# Patient Record
Sex: Female | Born: 1960 | Race: White | Hispanic: No | Marital: Married | Smoking: Never smoker
Health system: Southern US, Community
[De-identification: ages and names within clinical notes are randomized; demographics above are authoritative.]

## PROBLEM LIST (undated history)

## (undated) DIAGNOSIS — R519 Headache, unspecified: Secondary | ICD-10-CM

## (undated) DIAGNOSIS — R600 Localized edema: Secondary | ICD-10-CM

## (undated) DIAGNOSIS — Z87442 Personal history of urinary calculi: Secondary | ICD-10-CM

## (undated) DIAGNOSIS — I447 Left bundle-branch block, unspecified: Secondary | ICD-10-CM

## (undated) DIAGNOSIS — G8929 Other chronic pain: Secondary | ICD-10-CM

## (undated) DIAGNOSIS — I499 Cardiac arrhythmia, unspecified: Secondary | ICD-10-CM

## (undated) DIAGNOSIS — E785 Hyperlipidemia, unspecified: Secondary | ICD-10-CM

## (undated) DIAGNOSIS — N6310 Unspecified lump in the right breast, unspecified quadrant: Secondary | ICD-10-CM

## (undated) HISTORY — PX: MANDIBLE SURGERY: SHX707

## (undated) HISTORY — PX: CHOLECYSTECTOMY: SHX55

## (undated) HISTORY — PX: BREAST EXCISIONAL BIOPSY: SUR124

## (undated) HISTORY — PX: LUMBAR FUSION: SHX111

## (undated) HISTORY — PX: SPINE SURGERY: SHX786

## (undated) HISTORY — PX: CERVICAL FUSION: SHX112

## (undated) HISTORY — PX: ABDOMINAL HYSTERECTOMY: SHX81

---

## 1998-03-31 ENCOUNTER — Other Ambulatory Visit: Admission: RE | Admit: 1998-03-31 | Discharge: 1998-03-31 | Payer: Self-pay | Admitting: Obstetrics and Gynecology

## 1999-06-01 ENCOUNTER — Other Ambulatory Visit: Admission: RE | Admit: 1999-06-01 | Discharge: 1999-06-01 | Payer: Self-pay | Admitting: Obstetrics and Gynecology

## 2000-06-20 ENCOUNTER — Other Ambulatory Visit: Admission: RE | Admit: 2000-06-20 | Discharge: 2000-06-20 | Payer: Self-pay | Admitting: Obstetrics and Gynecology

## 2001-07-06 ENCOUNTER — Other Ambulatory Visit: Admission: RE | Admit: 2001-07-06 | Discharge: 2001-07-06 | Payer: Self-pay | Admitting: Obstetrics and Gynecology

## 2002-07-05 ENCOUNTER — Other Ambulatory Visit: Admission: RE | Admit: 2002-07-05 | Discharge: 2002-07-05 | Payer: Self-pay | Admitting: Obstetrics and Gynecology

## 2003-07-05 ENCOUNTER — Other Ambulatory Visit: Admission: RE | Admit: 2003-07-05 | Discharge: 2003-07-05 | Payer: Self-pay | Admitting: Obstetrics and Gynecology

## 2003-10-09 ENCOUNTER — Ambulatory Visit (HOSPITAL_COMMUNITY): Admission: RE | Admit: 2003-10-09 | Discharge: 2003-10-09 | Payer: Self-pay | Admitting: Neurosurgery

## 2003-12-19 ENCOUNTER — Encounter: Admission: RE | Admit: 2003-12-19 | Discharge: 2003-12-29 | Payer: Self-pay | Admitting: Neurology

## 2004-01-05 ENCOUNTER — Encounter: Admission: RE | Admit: 2004-01-05 | Discharge: 2004-01-05 | Payer: Self-pay | Admitting: Neurosurgery

## 2004-01-20 ENCOUNTER — Encounter: Admission: RE | Admit: 2004-01-20 | Discharge: 2004-01-20 | Payer: Self-pay | Admitting: Neurosurgery

## 2004-02-10 ENCOUNTER — Encounter: Admission: RE | Admit: 2004-02-10 | Discharge: 2004-02-10 | Payer: Self-pay | Admitting: Neurosurgery

## 2004-07-18 ENCOUNTER — Other Ambulatory Visit: Admission: RE | Admit: 2004-07-18 | Discharge: 2004-07-18 | Payer: Self-pay | Admitting: Obstetrics and Gynecology

## 2004-08-17 ENCOUNTER — Encounter: Admission: RE | Admit: 2004-08-17 | Discharge: 2004-08-17 | Payer: Self-pay | Admitting: Neurosurgery

## 2004-09-13 ENCOUNTER — Encounter: Admission: RE | Admit: 2004-09-13 | Discharge: 2004-09-13 | Payer: Self-pay | Admitting: Neurosurgery

## 2004-10-05 ENCOUNTER — Encounter: Admission: RE | Admit: 2004-10-05 | Discharge: 2004-10-05 | Payer: Self-pay | Admitting: Neurosurgery

## 2004-10-16 ENCOUNTER — Encounter: Admission: RE | Admit: 2004-10-16 | Discharge: 2004-10-16 | Payer: Self-pay | Admitting: Neurosurgery

## 2004-11-09 ENCOUNTER — Inpatient Hospital Stay (HOSPITAL_COMMUNITY): Admission: RE | Admit: 2004-11-09 | Discharge: 2004-11-10 | Payer: Self-pay | Admitting: Neurosurgery

## 2005-09-23 ENCOUNTER — Ambulatory Visit: Payer: Self-pay | Admitting: Cardiology

## 2005-10-14 ENCOUNTER — Other Ambulatory Visit: Admission: RE | Admit: 2005-10-14 | Discharge: 2005-10-14 | Payer: Self-pay | Admitting: Obstetrics and Gynecology

## 2005-10-17 ENCOUNTER — Encounter (INDEPENDENT_AMBULATORY_CARE_PROVIDER_SITE_OTHER): Payer: Self-pay | Admitting: Specialist

## 2005-10-17 ENCOUNTER — Ambulatory Visit (HOSPITAL_BASED_OUTPATIENT_CLINIC_OR_DEPARTMENT_OTHER): Admission: RE | Admit: 2005-10-17 | Discharge: 2005-10-17 | Payer: Self-pay | Admitting: Otolaryngology

## 2005-11-18 ENCOUNTER — Encounter (INDEPENDENT_AMBULATORY_CARE_PROVIDER_SITE_OTHER): Payer: Self-pay | Admitting: *Deleted

## 2005-11-18 ENCOUNTER — Encounter: Admission: RE | Admit: 2005-11-18 | Discharge: 2005-11-18 | Payer: Self-pay | Admitting: Obstetrics and Gynecology

## 2005-11-18 HISTORY — PX: BREAST BIOPSY: SHX20

## 2006-11-21 ENCOUNTER — Ambulatory Visit (HOSPITAL_COMMUNITY): Admission: RE | Admit: 2006-11-21 | Discharge: 2006-11-22 | Payer: Self-pay | Admitting: Neurosurgery

## 2006-12-08 ENCOUNTER — Encounter: Admission: RE | Admit: 2006-12-08 | Discharge: 2006-12-08 | Payer: Self-pay | Admitting: Obstetrics and Gynecology

## 2006-12-09 ENCOUNTER — Ambulatory Visit (HOSPITAL_COMMUNITY): Admission: RE | Admit: 2006-12-09 | Discharge: 2006-12-09 | Payer: Self-pay | Admitting: Neurosurgery

## 2006-12-26 ENCOUNTER — Ambulatory Visit: Payer: Self-pay | Admitting: Cardiology

## 2007-03-02 ENCOUNTER — Ambulatory Visit: Payer: Self-pay | Admitting: Gastroenterology

## 2007-03-02 LAB — CONVERTED CEMR LAB
Basophils Absolute: 0 10*3/uL (ref 0.0–0.1)
Basophils Relative: 0.6 % (ref 0.0–1.0)
Eosinophils Absolute: 0.2 10*3/uL (ref 0.0–0.6)
Eosinophils Relative: 3.2 % (ref 0.0–5.0)
HCT: 38.7 % (ref 36.0–46.0)
Hemoglobin: 13.5 g/dL (ref 12.0–15.0)
Lymphocytes Relative: 20.5 % (ref 12.0–46.0)
MCHC: 34.8 g/dL (ref 30.0–36.0)
MCV: 87.2 fL (ref 78.0–100.0)
Monocytes Absolute: 0.5 10*3/uL (ref 0.2–0.7)
Monocytes Relative: 7.5 % (ref 3.0–11.0)
Neutro Abs: 5 10*3/uL (ref 1.4–7.7)
Neutrophils Relative %: 68.2 % (ref 43.0–77.0)
Platelets: 232 10*3/uL (ref 150–400)
RBC: 4.44 M/uL (ref 3.87–5.11)
RDW: 13.2 % (ref 11.5–14.6)
WBC: 7.2 10*3/uL (ref 4.5–10.5)

## 2007-03-06 ENCOUNTER — Ambulatory Visit: Payer: Self-pay | Admitting: Gastroenterology

## 2007-11-17 ENCOUNTER — Ambulatory Visit: Payer: Self-pay | Admitting: Cardiology

## 2008-01-15 ENCOUNTER — Encounter: Admission: RE | Admit: 2008-01-15 | Discharge: 2008-01-15 | Payer: Self-pay | Admitting: Obstetrics and Gynecology

## 2008-01-21 ENCOUNTER — Ambulatory Visit: Payer: Self-pay | Admitting: Cardiology

## 2008-01-29 ENCOUNTER — Ambulatory Visit: Payer: Self-pay

## 2008-02-25 ENCOUNTER — Ambulatory Visit: Payer: Self-pay | Admitting: Cardiology

## 2008-02-29 ENCOUNTER — Ambulatory Visit: Payer: Self-pay | Admitting: Cardiology

## 2009-01-16 ENCOUNTER — Encounter: Admission: RE | Admit: 2009-01-16 | Discharge: 2009-01-16 | Payer: Self-pay | Admitting: Obstetrics and Gynecology

## 2009-05-31 ENCOUNTER — Encounter: Admission: RE | Admit: 2009-05-31 | Discharge: 2009-05-31 | Payer: Self-pay | Admitting: Neurosurgery

## 2009-06-02 ENCOUNTER — Encounter (INDEPENDENT_AMBULATORY_CARE_PROVIDER_SITE_OTHER): Payer: Self-pay | Admitting: *Deleted

## 2010-01-22 ENCOUNTER — Encounter: Admission: RE | Admit: 2010-01-22 | Discharge: 2010-01-22 | Payer: Self-pay | Admitting: Obstetrics and Gynecology

## 2010-12-20 ENCOUNTER — Other Ambulatory Visit: Payer: Self-pay | Admitting: Obstetrics and Gynecology

## 2010-12-20 DIAGNOSIS — Z1231 Encounter for screening mammogram for malignant neoplasm of breast: Secondary | ICD-10-CM

## 2011-01-24 ENCOUNTER — Other Ambulatory Visit: Payer: Self-pay | Admitting: Otolaryngology

## 2011-01-24 ENCOUNTER — Ambulatory Visit
Admission: RE | Admit: 2011-01-24 | Discharge: 2011-01-24 | Disposition: A | Discharge status: Home / Self Care | Payer: 59 | Source: Ambulatory Visit | Attending: Otolaryngology | Admitting: Otolaryngology

## 2011-01-24 DIAGNOSIS — R51 Headache: Secondary | ICD-10-CM

## 2011-02-04 ENCOUNTER — Ambulatory Visit
Admission: RE | Admit: 2011-02-04 | Discharge: 2011-02-04 | Disposition: A | Discharge status: Home / Self Care | Payer: 59 | Source: Ambulatory Visit | Attending: Obstetrics and Gynecology | Admitting: Obstetrics and Gynecology

## 2011-02-04 DIAGNOSIS — Z1231 Encounter for screening mammogram for malignant neoplasm of breast: Secondary | ICD-10-CM

## 2011-03-12 ENCOUNTER — Other Ambulatory Visit: Payer: Self-pay | Admitting: Neurosurgery

## 2011-03-12 DIAGNOSIS — R519 Headache, unspecified: Secondary | ICD-10-CM

## 2011-03-13 ENCOUNTER — Other Ambulatory Visit: Payer: Self-pay | Admitting: Neurosurgery

## 2011-03-13 DIAGNOSIS — R519 Headache, unspecified: Secondary | ICD-10-CM

## 2011-03-19 ENCOUNTER — Ambulatory Visit
Admission: RE | Admit: 2011-03-19 | Discharge: 2011-03-19 | Disposition: A | Discharge status: Home / Self Care | Payer: 59 | Source: Ambulatory Visit | Attending: Neurosurgery | Admitting: Neurosurgery

## 2011-03-19 DIAGNOSIS — R519 Headache, unspecified: Secondary | ICD-10-CM

## 2011-03-26 NOTE — Assessment & Plan Note (Signed)
[facility]                            CARDIOLOGY OFFICE NOTE   [name], [name]                     MRN:          [id number]  DATE:02/29/2008                            DOB:          1961/10/06    [name] returns today for followup.  She had a new left bundle  branch block and was having some dizziness and lightheadedness.  We  wanted to make sure she did not have any bradycardia.   A 24-hour Holter shows basically normal sinus rhythm with average heart  rate of 81 beats per minute.  Her maximum heart rate is 146 beats per  minute at 11:34 a.m. and her minimum heart rate with 65 beats a minute  at 7:43 p.m.Marland Kitchen  She had only 6 total PVCs with two different  morphologies.  There was no ventricular arrhythmias.  There were no  significant pauses.  She had no supraventricular events.   Her 2-D echocardiogram again was retrieved from [facility].  She has normal,  basically normal, EKG.   Her biggest complaint today is that she has had some lower extremity  swelling.  Her weight is up about 6 pounds from our last visit.  She  takes Relafen and she cooks with sodium.  There is no orthopnea, PND.   Her blood pressure is 112/70, her pulse is 86 and regular.  Weight is  150.  HEENT:  Unchanged.  Carotid upstrokes were equal bilaterally without  obvious bruits.  There is no JVD.  LUNGS:  Clear.  HEART:  Reveals a regular rate and rhythm without S4.  ABDOMEN: Soft.  EXTREMITIES:  Reveal only trace pitting edema.  Pulses are intact.  NEURO:  Exam is intact.   Stress Myoview January 29, 2008 showed EF of 59% with septal dyssynergy  consistent with a left bundle branch block.  There was no ischemia.   ASSESSMENT/PLAN:  I have advised [name] that her 2-D  echocardiogram, her Holter monitor and her stress nuclear study are  essentially normal.  I think the clinical significance her left bundle  is of no consequence.  I do think the lower extremity edema  is probably  secondary to decreased activity, Relafen and use of sodium.  I have  advised her to increase her walking now  that she is over her neuro surgery.  She will minimize her sodium  intake.  I will see her back on a p.r.n. basis.     Thomas C. Daleen Squibb, MD, [facility]  Electronically Signed    TCW/MedQ  DD: 02/29/2008  DT: 02/29/2008  Job #: [id number]   cc:   [name], M.D.  [name], M.D.

## 2011-03-26 NOTE — Assessment & Plan Note (Signed)
[facility]                            CARDIOLOGY OFFICE NOTE   SIREN, PORRATA                     MRN:          [id number]  DATE:01/21/2008                            DOB:          01-16-1961    [name] returns today for further management of her cardiac risk  factors.  We have been seeing her for hyperlipidemia.   She had some neuro surgery done by Dr. Trey Sailors [facility].  She  developed some postoperative dizziness and apparently had a new left  bundle on her EKG.  Dr. Lewayne Bunting of our group did a 2-D echocardiogram  which was essentially normal.  I have reviewed that with her today.   She has no symptoms of angina or ischemia other than just a little bit  of dyspnea on exertion.  She also has had no presyncope or syncope other  than some intermittent dizziness which sounds mostly postural.  She  denies any palpitations.  There is no orthopnea, PND or peripheral  edema.   CURRENT MEDICATIONS:  1. Simvastatin 20 mg q.h.s.  2. Aspirin 81 mg daily.  3. Gabapentin 300 mg b.i.d.  4. Relafen 500 b.i.d.  5. Multivitamin.   She is in no acute distress.  Her blood pressure is 114/74, pulse 69 and  regular.  Weight is 144.  HEENT:  Normocephalic, atraumatic.  PERRLA.  Extraocular movement is  intact.  Sclera clear.  Facial symmetry is normal.  Carotid upstrokes  are equal bilaterally without bruits, no JVD.  Thyroid is not enlarged.  Trachea is midline.  LUNGS:  Clear.  HEART:  Reveals regular rate and rhythm with a paradoxical split S2.  ABDOMEN:  Soft, good bowel sounds.  No midline bruit.  No hepatomegaly.  EXTREMITIES:  No cyanosis, clubbing or edema.  Pulses are intact.  NEUROLOGIC:  Intact.   I repeated her EKG today which shows sinus rhythm with a left bundle.   ASSESSMENT/PLAN:  Ms. [name] has a new left bundle for no obvious  reason.  She has a normal 2-D echocardiogram.  She is having some  symptoms of dizziness  and lightheadedness.  We will obtain a 24-hour  Holter to make sure she is not having any bradycardia.  In addition, I  would like to get an exercise rest stress Myoview considering her risk  factors to make sure she is not sitting on occult obstructive coronary  disease.   I will have her come back in mid April to review these findings.     Thomas C. Daleen Squibb, MD, [facility]  Electronically Signed    TCW/MedQ  DD: 01/21/2008  DT: 01/22/2008  Job #: [id number]   cc:   Payton Doughty, M.D.  Guy Sandifer Henderson Cloud, M.D.  Lianne Bushy, M.D.

## 2011-03-29 NOTE — Assessment & Plan Note (Signed)
[facility]                                 ON-CALL NOTE   NAME:LANGLEYZylah, Elsbernd                     MRN:          [id number]  DATE:03/08/2007                            DOB:          10-Mar-1961    TIME OF CALL:  8:45 a.m.   TELEPHONE NUMBER:  [phone].   PHYSICIAN:  Rachael Fee, M.D.   Ms. Freyre reports significant postprandial abdominal bloating and no  bowel movement since her colonoscopy performed on Thursday.  Her  symptoms have progressively worsened.  She denies any localizing pain,  nausea, vomiting, fevers, or chills.  She states the colonoscopy was  normal, and it was performed for Hemoccult-positive stool.  She  generally does not have problems with constipation or any other  digestive problems.  I advised her to begin clear liquids and to take 1-  2 teaspoons of Milk of Magnesia now and repeat in 6-8 hours if she does  not have a bowel movement.  After her bowels move or she is able to pass  flatus, she may advance her diet.  She is to call if her symptoms worsen  in any way.     Venita Lick. Russella Dar, MD, [facility]  Electronically Signed    MTS/MedQ  DD: 03/08/2007  DT: 03/08/2007  Job #: [id number]   cc:   Rachael Fee, MD

## 2011-03-29 NOTE — Op Note (Signed)
[name], [name]              ACCOUNT NO.:  [id number]   MEDICAL RECORD NO.:  [id number]          PATIENT TYPE:  AMB   LOCATION:  DSC                          FACILITY:  [facility]   PHYSICIAN:  Hermelinda Medicus, M.D.   DATE OF BIRTH:  Mar 24, 1961   DATE OF PROCEDURE:  10/17/2005  DATE OF DISCHARGE:                                 OPERATIVE REPORT   PREOPERATIVE DIAGNOSES:  1.  Septal deviation.  2.  Turbinate hypertrophy with severe nasal obstruction.  3.  History of sinusitis.   POSTOPERATIVE DIAGNOSES:  1.  Septal deviation.  2.  Turbinate hypertrophy with severe nasal obstruction.  3.  History of sinusitis.   OPERATION:  Septal reconstruction and turbinate reduction.   OPERATOR:  Hermelinda Medicus, M.D.   ANESTHESIA:  Local MAC with Dr. Gelene Mink.   PROCEDURE:  The patient was placed in the supine position; under local  anesthesia with 1% Xylocaine with epinephrine and plain Xylocaine, and  also  topical cocaine 200 mg.  The patient was prepped and draped over in the  appropriate way.  The hemitransfixion incision was made on the left side,  carried back along the quadrilateral cartilage to the posterior aspect of  this cartilage, and then a strip of cartilage was taken.  The ethmoid septal  deviation, which was not only deviated but also widened, was corrected using  the open and close Jansen-Middleton.  The vomer and septum was also pushed  way over to the left side, and was off the premaxillary crest on the left;  therefore the 4-mm chisel was used to take this down. Once this was  completed, the septum was brought back to the midline. The closure was with  5-0 plain catgut and a through-and-through septal suture using 4-0 plain x2.  Once this was completed, the Elmed bipolar cautery set at 12 was used to  reduce the inferior turbinates. The middle turbinates were pushed away from  the ethmoid ostia, as they were squeezed up against the septum and the  ethmoid; then the  inferior turbinates were outfractured. The patient  tolerated the procedure well.  Then #8 Merocel packs were placed, and we will plan to remove these this  afternoon; and then 1 week, 3 weeks and 6 weeks for postop follow-up.           ______________________________  Hermelinda Medicus, M.D.    JC/MEDQ  D:  10/17/2005  T:  10/17/2005  Job:  [id number]   cc:   Lianne Bushy, M.D.  Fax: [phone]

## 2011-03-29 NOTE — H&P (Signed)
[name], [name]              ACCOUNT NO.:  [id number]   MEDICAL RECORD NO.:  [id number]          PATIENT TYPE:  AMB   LOCATION:  SDS                          FACILITY:  [facility]   PHYSICIAN:  Payton Doughty, M.D.      DATE OF BIRTH:  [birth year]   DATE OF ADMISSION:  11/21/2006  DATE OF DISCHARGE:                              HISTORY & PHYSICAL   ADMITTING DIAGNOSIS:  Left radial tunnel.   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  She  developed some pain into her left arm and tolerated it for a while.  It  has become where she has developed radial tunnel on the left side and  she is now admitted for a radial tunnel release.   MEDICAL HISTORY:  Is very benign.  She uses Phenergan and Xenical.   SURGICAL HISTORY:  Carpal tunnel, a jaw operation, a hysterectomy in '98  as well as an anterior decompression fusion of 5-6 and 4-5 and L5.   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   FAMILY HISTORY:  Her mom is 100 and in good health, thyroid disease.  Father is deceased from heart disease.   REVIEW OF SYSTEMS:  Remarkable for glasses, swelling in her hands and  feet, neck pain, occasional excessive thirst.   PHYSICAL EXAMINATION:  HEENT EXAM:  Within normal limits.  NECK:  She has good range of motion in her neck.  I cannot reproduce her  shoulder and arm pain.  CHEST:  Clear.  CARDIAC EXAM:  Regular rate and rhythm.  ABDOMEN:  Nontender.  There is no hepatosplenomegaly.  EXTREMITIES:  Without clubbing or cyanosis.  Peripheral pulses are good.  GU EXAM:  Deferred.  NEUROLOGICALLY:  She is awake, alert, and oriented.  Cranial nerves are  intact.  Motor exam shows 5/5 strength throughout the upper and lower  extremities.  Sensory dysesthesia is described in her right side in  radial nerve distribution.  She has tenderness in the extensor muscle  mass  on the left side and discomfort, resists extension of the middle  finger.   CLINICAL IMPRESSION:  Radial tunnel.   PLAN:  For a radial nerve release.  The risks and benefits have already  been discussed with her and she wishes to proceed.           ______________________________  Payton Doughty, M.D.     MWR/MEDQ  D:  11/21/2006  T:  11/21/2006  Job:  [id number]

## 2011-03-29 NOTE — Assessment & Plan Note (Signed)
[facility]                         GASTROENTEROLOGY OFFICE NOTE   [name], Park                     MRN:          [id number]  DATE:03/02/2007                            DOB:          09-28-1961    REFERRING PHYSICIAN:  Guy Sandifer. Henderson Cloud, M.D.   PRIMARY CARE PHYSICIAN:  Lianne Bushy, M.D.   REASON FOR REFERRAL:  Dr. Henderson Cloud asked me to evaluate Ms. [name] in  consultation regarding heme positive stool.   HISTORY OF PRESENT ILLNESS:  [name] is a very pleasant, 50 year old  woman who had her yearly gynecologic exam and was found to have heme  positive stool on fecal occult blood testing. She has no overt GI  bleeding. She has had this stool testing done annually for some time and  none have ever been positive. She used to have constipation as a young  girl but over the years things have improved and she moves her bowels  once every 1-2 days. She does take NSAIDs, 2 Advil probably 2-3 times a  week as well as Catering manager more recently.   In the past week, she has had nausea and a new headache. She has had  spinal surgeries in the past and is scheduled to see her neurosurgeon to  discuss her new headaches. Because of the nausea and the headaches, she  has been taking Catering manager and that has seemed to help.   REVIEW OF SYSTEMS:  Notable for very intermittent black stools. These  are solid. Intentional weight loss of 20 pounds, the rest of her review  of systems essentially normal and is available on her nursing intake  sheet.   PAST MEDICAL HISTORY:  2005 anterior cervical fusion, left arm nerve  decompression in 2008, breast surgery in 2007, hysterectomy in 1997,  kidney stones 10 years ago, elevated cholesterol.   CURRENT MEDICATIONS:  Simvastatin and an aspirin once daily.   ALLERGIES:  CODEINE and ERYTHROMYCIN.   SOCIAL HISTORY:  Married, 1 child, works as a Armed forces technical officer,  nonsmoker, nondrinker.   FAMILY HISTORY:   No colon cancer or colon polyps in the family.   PHYSICAL EXAMINATION:  VITAL SIGNS:  5 foot 4 inches, 149 pounds, blood  pressure 122/72, pulse 68.  CONSTITUTIONAL:  Generally well appearing.  NEUROLOGIC:  Alert and oriented x3.  EYES:  Extraocular movements intact.  MOUTH:  Oropharynx moist no lesions.  NECK:  Supple, no lymphadenopathy.  CARDIOVASCULAR:  Heart regular rate and rhythm.  LUNGS:  Clear to auscultation bilaterally.  ABDOMEN:  Soft, nontender, nondistended, normal bowel sounds.  EXTREMITIES:  No lower extremity edema.  SKIN:  No rashes or lesions on visible extremities.   ASSESSMENT/PLAN:  A 50 year old woman with heme positive stool on  routine testing.   First, she should have a full colonoscopy at her soonest convenience to  investigate the heme positive stools. She is at routine risk for  colorectal cancer and so if the colonoscopy is normal, she would not  need another one for 10 years. She does take NSAIDs fairly frequently  and I have advised her to stop this  and use Tylenol for routine everyday  pains as NSAIDs are known to cause ulcer disease. Her nausea and  headaches recently, it is not clear what is causing these but she is  going to be evaluated by her neurosurgeon tomorrow or the next day for  her headaches. My plan would have been to image her brain to see if  there are any space occupying lesions in her brain that could account  for both her nausea and her headaches. I doubt that is the case  fortunately. Her colonoscopy is normal and she continues to have  troubles with nausea. Will have to consider upper GI examination. She  will get a CBC today to see if she is anemic although clinically she  does not appear to be so.     Rachael Fee, MD  Electronically Signed    DPJ/MedQ  DD: 03/02/2007  DT: 03/02/2007  Job #: [id number]   cc:   Guy Sandifer. Henderson Cloud, M.D.  Lianne Bushy, M.D.

## 2011-03-29 NOTE — Op Note (Signed)
[name], [name]              ACCOUNT NO.:  [id number]   MEDICAL RECORD NO.:  [id number]          PATIENT TYPE:  AMB   LOCATION:  SDS                          FACILITY:  [facility]   PHYSICIAN:  Payton Doughty, M.D.      DATE OF BIRTH:  Mar 26, 1961   DATE OF PROCEDURE:  11/21/2006  DATE OF DISCHARGE:                               OPERATIVE REPORT   PREOPERATIVE DIAGNOSIS:  Right radial tunnel syndrome.   POSTOPERATIVE DIAGNOSIS:  Right radial tunnel syndrome.   OPERATIVE PROCEDURE:  Right radial nerve decompression.   SURGEON:  Payton Doughty, M.D.   ANESTHESIA:  Endotracheal with an LMA.   COMPLICATIONS:  None.   BODY OF THE TEXT:  This is a 50 year old girl with left radial tunnel  syndrome.  She is taken to the operating room, smoothly anesthetized,  had LMA placed.  She was placed supine on the operating table.  A  curvilinear incision was marked out starting just about 5 cm proximal to  the elbow crease over the brachialis muscle, this was carried down to 2  cm above the elbow crease and laterally around the crease, then out over  the brachialis radialis muscle for a distance of about 5 cm. The fascial  plane was rapidly obtained with subcutaneous dissection.  The biceps  tendon and the brachial radialis muscle identified, the brachial  radialis was identified.  Working the hiatus between the two, the radial  nerve was identified.  It was dissected distally, this was coagulated  and divided, it with source of entrapment for the nerve.  This  corresponded exactly to where her pain was on palpation during her  preoperative exam.  The bifurcation of the nerve into the posterior  osseous and superficial radial nerve was identified. Both branches were  followed distally and found to be unencumbered. The wound was irrigated.  Hemostasis was assured.  Successive layers of 2-0 Vicryl and 4-0 nylon  were used to close.  Betadine and Telfa dressing was applied and the  patient returned to  the recovery room in good condition.           ______________________________  Payton Doughty, M.D.     MWR/MEDQ  D:  11/21/2006  T:  11/21/2006  Job:  [id number]

## 2011-03-29 NOTE — Assessment & Plan Note (Signed)
[facility]                            CARDIOLOGY OFFICE NOTE   [name], [name]                     MRN:          [id number]  DATE:12/26/2006                            DOB:          22-Mar-1961    [name] returns today for further management of her hyperlipidemia.  Our last visit was September 23, 2005.  She is on Simvastatin 20 mg  q.h.s. and an aspirin a day.  Her last lipid profile was Mar 11, 2006,  total cholesterol 153, triglycerides 70, HDL 61, LDL 78.  Her risk  factors are a strong family history, though her brother and father both  smoked as outlined in previous notes.  She was moderately overweight,  but now has lost about 19 pounds!  She walks almost everyday with her  sister-in-law.   PHYSICAL EXAMINATION:  VITAL SIGNS:  Her blood pressure today is 110/71,  her pulse is 75 and regular, weight is 143.  HEENT:  Normocephalic, atraumatic, PERRLA, extraocular movements intact,  sclerae clear.  NECK:  Carotid upstrokes are equal bilaterally without bruits, there is  no JVD, thyroid is not enlarged, trachea is midline.  LUNGS:  Clear.  HEART:  Regular rate and rhythm without murmurs, gallops, or rubs.  ABDOMEN:  Soft with good bowel sounds.  There is no midline bruit.  There is no hepatomegalia.  EXTREMITIES:  No cyanosis, clubbing, or edema.  Pulses are brisk.  NEUROLOGICAL:  Exam is intact.   She recently had some surgery at [facility] of her left elbow.  Labs from  January 11 were within normal limits including a comprehensive metabolic  panel, coag studies, CBC, and UA.  Lipids obviously were not drawn.   ASSESSMENT/PLAN:  I am delighted Ms. Mallin is doing well.  I am also  delighted with her weight loss and daily walking.  She is happy with  taking the Simvastatin and having no problems.  Her lipids are at goal.  We have renewed her Simvastatin and she will have lipids and LFTs  checked again in July 2008.     Thomas C. Daleen Squibb,  MD, [facility]  Electronically Signed    TCW/MedQ  DD: 12/26/2006  DT: 12/26/2006  Job #: [id number]   cc:   Guy Sandifer. Henderson Cloud, M.D.  Lianne Bushy, M.D.

## 2011-03-29 NOTE — H&P (Signed)
NAMEMENUCHA, DICESARE              ACCOUNT NO.:  [id number]   MEDICAL RECORD NO.:  [id number]          PATIENT TYPE:  AMB   LOCATION:  DSC                          FACILITY:  [facility]   PHYSICIAN:  Hermelinda Medicus, M.D.   DATE OF BIRTH:  Feb 12, 1961   DATE OF ADMISSION:  DATE OF DISCHARGE:                                HISTORY & PHYSICAL   Patient is a 50 year old female who has had a history of nasal obstruction  and sinusitis for some time.  She has a narrow nose, does not breathe  through her nose.  Has been on antibiotics using Omnicef, Maxifed G to try  to get the sinuses under control and yet she still continues to be a mouth  breather.  Even when resting she has to be a mouth breather.  She had a CAT  scan which showed her sinuses to be able to be cleared but her septal  deviation was obvious.  She now enters for a septal reconstruction turbinate  reduction.  Her past history is that of E-Mycin causing jaundice and Demerol  causing nausea and vomiting.  She is on medication using Crestor 10 mg,  Maxifed G which is a half a tablet b.i.d. under my care, and some plain  Tylenol.  She also takes AMBI 60 at times.  She does not smoke or drink.  She has had a cervical anterior diskectomy and fusion by Dr. Trey Sailors and  she has had right knee surgery back in 1990s.  She had TMJ surgery in 1992  and a hysterectomy in 1998.  Blood pressure is normally low.  It is now  114/69.   PHYSICAL EXAMINATION:  VITAL SIGNS:  Blood pressure 114/69, heart rate of  55, weight 156.  HEENT:  Her ears are clear.  Tympanic membranes are clear.  The nose shows a  septal deviation and thickening of her ethmoid but the septal deviation is  off the premaxillary crest on the left and then swings back to the right in  the ethmoid region.  The oral cavity is clear.  The larynx is clear.  True  cords, false cords, epiglottis, base of tongue are clear.  True cord  mobility, gag reflex, tongue mobility normal.  The  neck is free of any  thyromegaly, cervical adenopathy, or mass.  CHEST:  Clear.  No rales, rhonchi, or wheezes.  CARDIOVASCULAR:  No __________ murmurs or gallops.  ABDOMEN:  Unremarkable.  EXTREMITIES:  Unremarkable.  Previous history noted.  NECK:  Previous history noted.   INITIAL DIAGNOSES:  1.  Sinusitis history with septal deviation with nasal obstruction.  2.  History of cervical anterior laminectomy and fusion in 2005.  3.  Knee surgery in the 1990s right side.  4.  TMJ treatment/surgery in 1992.  5.  Hysterectomy in 1998.  6.  Extremely low blood pressure noted.           ______________________________  Hermelinda Medicus, M.D.     JC/MEDQ  D:  10/17/2005  T:  10/17/2005  Job:  [id number]   cc:   Lianne Bushy, M.D.  Fax:  [phone] 

## 2011-03-29 NOTE — Op Note (Signed)
[name], [name]              ACCOUNT NO.:  [id number]   MEDICAL RECORD NO.:  [id number]          PATIENT TYPE:  INP   LOCATION:  2899                         FACILITY:  [facility]   PHYSICIAN:  Payton Doughty, M.D.      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   POSTOPERATIVE DIAGNOSIS:  Spondylosis with herniated disc at C4-C5 and C5-  C6.   OPERATIVE PROCEDURE:  C4-C5 and C5-C6 anterior cervical decompression and  fusion with a Reflex hybrid plate.   SURGEON:  Payton Doughty, M.D.   SERVICE:  Neurosurgery   ANESTHESIA:  General endotracheal anesthesia.   PREPARATION:  Betadine and alcohol wipe.   COMPLICATIONS:  None.   ASSISTANT:  Nurse assistant [facility]  Doctor assistant Danae Orleans. Venetia Maxon, M.D.   BODY OF TEXT:  50 year old lady with cervical radiculopathy and cervical  spondylosis and disc.  She is taken to the operating room, smoothly  anesthetized and intubated, placed supine on the operating table in the  halter head traction.  Following shave, prep, and drape in the usual sterile  fashion, the skin was incised in the midline at the medial border of the  sternocleidomastoid muscle on the left side.  The platysma was identified,  elevated, divided, and undermined.  The sternocleidomastoid was identified  and dissection revealed the carotid artery retracted laterally to the left,  trachea and esophagus retracted laterally to the right, exposing the bones  of the anterior cervical spine.  A marker was placed and interoperative x-  ray was obtained and confirmed the correct level.  The longus colli was  taken down bilaterally and the Shadowline  retractor was placed.  Discectomy  was then carried out at C4-C5 and C5-C6 under gross observation.  The  operating microscope was then brought in and we used  microdissection  technique to remove the remaining disc, divide the posterior annulus, divide  the posterior longitudinal ligament, and expose the neural foramina  bilaterally.  At C4-C5, there was extensive spondylitic change with  bilateral compression, worse on the left than on the right.  At C5-C6, there  was a central herniated disc which extended on both sides, slightly more  prominent on the right but extending further on the left.  Following  complete removal of the disc, the neural foramina were carefully explored on  each level and found to be free.  One small bleeding vein was controlled  with thrombin Gelfoam on the right side at C4-C5.  The wound was irrigated,  hemostasis assured.  7 mm bone grafts were fashioned with patellar allograft  and tapped into place.  A 30 mm Reflex hybrid plate was placed with 12 mm  screws, two in C4, two in C5, and two in C6.  Interoperative x-ray showed  good placement of bone graft, plate, and screws.  The wound was irrigated  and hemostasis assured.  The platysma was reapproximated with 3-0 Vicryl in  an interrupted fashion, the subcutaneous tissue were reapproximated with 3-0  Vicryl in an  interrupted fashion, the skin was closed with 4-0 Vicryl in a running  subcuticular fashion.  Benzoin and Steri-Strips were placed and made  occlusive with Telfa and OpSite.  The patient returned to the recovery room  in good condition after being placed in an Aspen collar.       MWR/MEDQ  D:  11/09/2004  T:  11/09/2004  Job:  [id number]

## 2011-03-29 NOTE — H&P (Signed)
NAMENEL, STONEKING              ACCOUNT NO.:  [id number]   MEDICAL RECORD NO.:  [id number]          PATIENT TYPE:  INP   LOCATION:  2899                         FACILITY:  [facility]   PHYSICIAN:  Payton Doughty, M.D.      DATE OF BIRTH:  07-19-1961   DATE OF ADMISSION:  11/09/2004  DATE OF DISCHARGE:                                HISTORY & PHYSICAL   ADMISSION DIAGNOSES:  1.  Herniated disk, C5-6.  2.  Spondylosis, C4-5.   This is a very nice 50 year old right-handed white female seen for about 18  months. She has had increasing neck pain. She developed some pain down into  her neck, right shoulder, and arm. She has also had some on the left. She  had an MRI that shows an herniated disk at C5-6 and spondylosis at C4-5, and  she is admitted for an anterior decompression and fusion. Medical history is  otherwise very benign. She uses Phenergan and Xenical for weight loss.   PAST SURGICAL HISTORY:  Carpal tunnel, jaw operation, and hysterectomy in  1998.   SOCIAL HISTORY:  Does not smoke or drink. She is a Chief Operating Officer.   FAMILY HISTORY:  Mother is 58 years of age and in good health; thyroid  disease. Father deceased from heart disease.   REVIEW OF SYSTEMS:  Remarkable for glasses, swelling of her hands and feet,  neck pain, occasional excessive thirst.   PHYSICAL EXAMINATION:  HEENT: Within normal limits.  NECK: Good range of motion of her neck and it can reproduce her shoulder and  arm pain.  CHEST: Clear.  CARDIAC: Regular rate and rhythm.  ABDOMEN: Nontender. No hepatosplenomegaly.  EXTREMITIES: Without clubbing or cyanosis. Peripheral pulses are good.  GU: Deferred.  NEUROLOGIC: She is awake, alert, and oriented. Cranial nerves are intact.  Motor exam shows 5/5 strength throughout the upper and lower extremities;  except for the right biceps which is 5-/5 and left triceps which is 5-/5.  Sensory dysesthesia is described in the C5-6 distribution. Reflexes  are 1 at  the biceps and triceps, 2 at the brachial radialis. Hoffman's negative.  Reflexes in the lower extremities as nonmyelopathic.   MRI results have been reviewed above and basically demonstrate spondylitic  change at C4-5 and herniated disk at  C5-6.   CLINICAL IMPRESSION:  Cervical spondylosis with early myelopathy.   PLAN:  Anterior cervical decompression and fusion at C4-5 and C5-6. The  risks and benefits of this procedure have been discussed with her and she  wishes to proceed.       MWR/MEDQ  D:  11/09/2004  T:  11/09/2004  Job:  [id number]

## 2011-04-01 ENCOUNTER — Other Ambulatory Visit: Payer: 59

## 2011-05-24 ENCOUNTER — Other Ambulatory Visit: Payer: Self-pay | Admitting: Neurosurgery

## 2011-05-24 DIAGNOSIS — M47812 Spondylosis without myelopathy or radiculopathy, cervical region: Secondary | ICD-10-CM

## 2011-05-31 ENCOUNTER — Ambulatory Visit
Admission: RE | Admit: 2011-05-31 | Discharge: 2011-05-31 | Disposition: A | Discharge status: Home / Self Care | Payer: 59 | Source: Ambulatory Visit | Attending: Neurosurgery | Admitting: Neurosurgery

## 2011-05-31 DIAGNOSIS — M47812 Spondylosis without myelopathy or radiculopathy, cervical region: Secondary | ICD-10-CM

## 2011-05-31 MED ORDER — DEXAMETHASONE SODIUM PHOSPHATE 4 MG/ML IJ SOLN
4.0000 mg | Freq: Once | INTRAMUSCULAR | Status: AC
  Administered 2011-05-31: 4 mg

## 2011-06-12 ENCOUNTER — Other Ambulatory Visit: Payer: Self-pay | Admitting: Neurosurgery

## 2011-06-12 DIAGNOSIS — M47812 Spondylosis without myelopathy or radiculopathy, cervical region: Secondary | ICD-10-CM

## 2011-06-28 ENCOUNTER — Inpatient Hospital Stay: Admission: RE | Admit: 2011-06-28 | Payer: 59 | Source: Ambulatory Visit

## 2011-07-04 ENCOUNTER — Ambulatory Visit
Admission: RE | Admit: 2011-07-04 | Discharge: 2011-07-04 | Disposition: A | Discharge status: Home / Self Care | Payer: 59 | Source: Ambulatory Visit | Attending: Neurosurgery | Admitting: Neurosurgery

## 2011-07-04 DIAGNOSIS — M47812 Spondylosis without myelopathy or radiculopathy, cervical region: Secondary | ICD-10-CM

## 2011-07-04 MED ORDER — DEXAMETHASONE SODIUM PHOSPHATE 4 MG/ML IJ SOLN
4.0000 mg | Freq: Once | INTRAMUSCULAR | Status: AC
  Administered 2011-07-04: 4 mg

## 2011-07-04 MED ORDER — IOHEXOL 300 MG/ML  SOLN
1.0000 mL | Freq: Once | INTRAMUSCULAR | Status: AC | PRN
  Administered 2011-07-04: 1 mL via EPIDURAL

## 2011-07-18 ENCOUNTER — Other Ambulatory Visit: Payer: Self-pay | Admitting: Neurosurgery

## 2011-07-18 DIAGNOSIS — M47812 Spondylosis without myelopathy or radiculopathy, cervical region: Secondary | ICD-10-CM

## 2011-07-20 ENCOUNTER — Ambulatory Visit
Admission: RE | Admit: 2011-07-20 | Discharge: 2011-07-20 | Disposition: A | Discharge status: Home / Self Care | Payer: 59 | Source: Ambulatory Visit | Attending: Neurosurgery | Admitting: Neurosurgery

## 2011-07-20 DIAGNOSIS — M47812 Spondylosis without myelopathy or radiculopathy, cervical region: Secondary | ICD-10-CM

## 2011-12-31 ENCOUNTER — Other Ambulatory Visit: Payer: Self-pay | Admitting: Neurosurgery

## 2011-12-31 DIAGNOSIS — M47816 Spondylosis without myelopathy or radiculopathy, lumbar region: Secondary | ICD-10-CM

## 2012-01-02 ENCOUNTER — Other Ambulatory Visit: Payer: Self-pay | Admitting: Obstetrics and Gynecology

## 2012-01-02 ENCOUNTER — Ambulatory Visit
Admission: RE | Admit: 2012-01-02 | Discharge: 2012-01-02 | Disposition: A | Discharge status: Home / Self Care | Payer: 59 | Source: Ambulatory Visit | Attending: Neurosurgery | Admitting: Neurosurgery

## 2012-01-02 DIAGNOSIS — Z1231 Encounter for screening mammogram for malignant neoplasm of breast: Secondary | ICD-10-CM

## 2012-01-02 DIAGNOSIS — M47816 Spondylosis without myelopathy or radiculopathy, lumbar region: Secondary | ICD-10-CM

## 2012-01-03 ENCOUNTER — Other Ambulatory Visit: Payer: 59

## 2012-02-05 ENCOUNTER — Ambulatory Visit
Admission: RE | Admit: 2012-02-05 | Discharge: 2012-02-05 | Disposition: A | Discharge status: Home / Self Care | Payer: 59 | Source: Ambulatory Visit | Attending: Obstetrics and Gynecology | Admitting: Obstetrics and Gynecology

## 2012-02-05 DIAGNOSIS — Z1231 Encounter for screening mammogram for malignant neoplasm of breast: Secondary | ICD-10-CM

## 2012-02-10 ENCOUNTER — Ambulatory Visit: Payer: 59

## 2012-02-19 ENCOUNTER — Other Ambulatory Visit: Payer: Self-pay | Admitting: Neurosurgery

## 2012-02-19 ENCOUNTER — Ambulatory Visit
Admission: RE | Admit: 2012-02-19 | Discharge: 2012-02-19 | Disposition: A | Discharge status: Home / Self Care | Payer: 59 | Source: Ambulatory Visit | Attending: Neurosurgery | Admitting: Neurosurgery

## 2012-02-19 DIAGNOSIS — M47816 Spondylosis without myelopathy or radiculopathy, lumbar region: Secondary | ICD-10-CM

## 2012-02-20 ENCOUNTER — Inpatient Hospital Stay: Admission: RE | Admit: 2012-02-20 | Payer: 59 | Source: Ambulatory Visit

## 2012-04-20 ENCOUNTER — Other Ambulatory Visit: Payer: Self-pay | Admitting: Neurosurgery

## 2012-04-20 ENCOUNTER — Ambulatory Visit
Admission: RE | Admit: 2012-04-20 | Discharge: 2012-04-20 | Disposition: A | Discharge status: Home / Self Care | Payer: 59 | Source: Ambulatory Visit | Attending: Neurosurgery | Admitting: Neurosurgery

## 2012-04-20 DIAGNOSIS — M47817 Spondylosis without myelopathy or radiculopathy, lumbosacral region: Secondary | ICD-10-CM

## 2012-04-20 MED ORDER — GADOBENATE DIMEGLUMINE 529 MG/ML IV SOLN
15.0000 mL | Freq: Once | INTRAVENOUS | Status: AC | PRN
  Administered 2012-04-20: 15 mL via INTRAVENOUS

## 2012-10-06 ENCOUNTER — Other Ambulatory Visit: Payer: Self-pay | Admitting: Neurosurgery

## 2012-10-06 DIAGNOSIS — M47816 Spondylosis without myelopathy or radiculopathy, lumbar region: Secondary | ICD-10-CM

## 2012-10-07 ENCOUNTER — Ambulatory Visit
Admission: RE | Admit: 2012-10-07 | Discharge: 2012-10-07 | Disposition: A | Discharge status: Home / Self Care | Payer: 59 | Source: Ambulatory Visit | Attending: Neurosurgery | Admitting: Neurosurgery

## 2012-10-07 DIAGNOSIS — M47816 Spondylosis without myelopathy or radiculopathy, lumbar region: Secondary | ICD-10-CM

## 2012-10-13 ENCOUNTER — Other Ambulatory Visit: Payer: Self-pay | Admitting: Neurosurgery

## 2012-10-13 DIAGNOSIS — M549 Dorsalgia, unspecified: Secondary | ICD-10-CM

## 2012-10-15 ENCOUNTER — Ambulatory Visit
Admission: RE | Admit: 2012-10-15 | Discharge: 2012-10-15 | Disposition: A | Discharge status: Home / Self Care | Payer: 59 | Source: Ambulatory Visit | Attending: Neurosurgery | Admitting: Neurosurgery

## 2012-10-15 VITALS — BP 110/71 | HR 81 | Ht 64.0 in | Wt 165.0 lb

## 2012-10-15 DIAGNOSIS — M5126 Other intervertebral disc displacement, lumbar region: Secondary | ICD-10-CM

## 2012-10-15 DIAGNOSIS — M549 Dorsalgia, unspecified: Secondary | ICD-10-CM

## 2012-10-15 MED ORDER — IOHEXOL 180 MG/ML  SOLN
1.0000 mL | Freq: Once | INTRAMUSCULAR | Status: AC | PRN
  Administered 2012-10-15: 1 mL via EPIDURAL

## 2012-10-15 MED ORDER — METHYLPREDNISOLONE ACETATE 40 MG/ML INJ SUSP (RADIOLOG
120.0000 mg | Freq: Once | INTRAMUSCULAR | Status: AC
  Administered 2012-10-15: 120 mg via EPIDURAL

## 2012-10-22 ENCOUNTER — Other Ambulatory Visit: Payer: Self-pay | Admitting: Neurosurgery

## 2012-10-22 DIAGNOSIS — M5126 Other intervertebral disc displacement, lumbar region: Secondary | ICD-10-CM

## 2012-10-29 ENCOUNTER — Ambulatory Visit
Admission: RE | Admit: 2012-10-29 | Discharge: 2012-10-29 | Disposition: A | Discharge status: Home / Self Care | Payer: 59 | Source: Ambulatory Visit | Attending: Neurosurgery | Admitting: Neurosurgery

## 2012-10-29 DIAGNOSIS — M5126 Other intervertebral disc displacement, lumbar region: Secondary | ICD-10-CM

## 2012-10-29 MED ORDER — IOHEXOL 180 MG/ML  SOLN
1.0000 mL | Freq: Once | INTRAMUSCULAR | Status: AC | PRN
  Administered 2012-10-29: 1 mL via EPIDURAL

## 2012-10-29 MED ORDER — METHYLPREDNISOLONE ACETATE 40 MG/ML INJ SUSP (RADIOLOG
120.0000 mg | Freq: Once | INTRAMUSCULAR | Status: AC
  Administered 2012-10-29: 120 mg via EPIDURAL

## 2012-12-26 ENCOUNTER — Other Ambulatory Visit: Payer: Self-pay

## 2013-02-08 ENCOUNTER — Other Ambulatory Visit: Payer: Self-pay

## 2013-02-08 DIAGNOSIS — Z1231 Encounter for screening mammogram for malignant neoplasm of breast: Secondary | ICD-10-CM

## 2013-02-09 ENCOUNTER — Ambulatory Visit
Admission: RE | Admit: 2013-02-09 | Discharge: 2013-02-09 | Disposition: A | Discharge status: Home / Self Care | Payer: 59 | Source: Ambulatory Visit

## 2013-02-09 DIAGNOSIS — Z1231 Encounter for screening mammogram for malignant neoplasm of breast: Secondary | ICD-10-CM

## 2013-03-22 ENCOUNTER — Other Ambulatory Visit: Payer: Self-pay | Admitting: Neurosurgery

## 2013-03-23 ENCOUNTER — Other Ambulatory Visit: Payer: Self-pay | Admitting: Neurosurgery

## 2013-03-23 DIAGNOSIS — M810 Age-related osteoporosis without current pathological fracture: Secondary | ICD-10-CM

## 2013-09-16 ENCOUNTER — Other Ambulatory Visit: Payer: Self-pay

## 2013-11-02 ENCOUNTER — Other Ambulatory Visit: Payer: Self-pay | Admitting: Neurosurgery

## 2013-11-02 DIAGNOSIS — M47816 Spondylosis without myelopathy or radiculopathy, lumbar region: Secondary | ICD-10-CM

## 2013-11-03 ENCOUNTER — Ambulatory Visit
Admission: RE | Admit: 2013-11-03 | Discharge: 2013-11-03 | Disposition: A | Discharge status: Home / Self Care | Payer: 59 | Source: Ambulatory Visit | Attending: Neurosurgery | Admitting: Neurosurgery

## 2013-11-03 ENCOUNTER — Other Ambulatory Visit: Payer: 59

## 2013-11-03 DIAGNOSIS — M47816 Spondylosis without myelopathy or radiculopathy, lumbar region: Secondary | ICD-10-CM

## 2014-01-18 ENCOUNTER — Other Ambulatory Visit: Payer: Self-pay

## 2014-01-18 DIAGNOSIS — Z1231 Encounter for screening mammogram for malignant neoplasm of breast: Secondary | ICD-10-CM

## 2014-02-14 ENCOUNTER — Ambulatory Visit
Admission: RE | Admit: 2014-02-14 | Discharge: 2014-02-14 | Disposition: A | Discharge status: Home / Self Care | Payer: 59 | Source: Ambulatory Visit

## 2014-02-14 DIAGNOSIS — Z1231 Encounter for screening mammogram for malignant neoplasm of breast: Secondary | ICD-10-CM

## 2014-03-30 ENCOUNTER — Other Ambulatory Visit: Payer: Self-pay | Admitting: Neurosurgery

## 2014-03-30 DIAGNOSIS — M47812 Spondylosis without myelopathy or radiculopathy, cervical region: Secondary | ICD-10-CM

## 2014-04-03 ENCOUNTER — Ambulatory Visit
Admission: RE | Admit: 2014-04-03 | Discharge: 2014-04-03 | Disposition: A | Discharge status: Home / Self Care | Payer: 59 | Source: Ambulatory Visit | Attending: Neurosurgery | Admitting: Neurosurgery

## 2014-04-03 DIAGNOSIS — M47812 Spondylosis without myelopathy or radiculopathy, cervical region: Secondary | ICD-10-CM

## 2014-05-20 ENCOUNTER — Ambulatory Visit (INDEPENDENT_AMBULATORY_CARE_PROVIDER_SITE_OTHER): Payer: Self-pay

## 2014-05-20 ENCOUNTER — Ambulatory Visit (INDEPENDENT_AMBULATORY_CARE_PROVIDER_SITE_OTHER): Payer: 59 | Admitting: Neurology

## 2014-05-20 DIAGNOSIS — R209 Unspecified disturbances of skin sensation: Secondary | ICD-10-CM

## 2014-05-20 DIAGNOSIS — Z0289 Encounter for other administrative examinations: Secondary | ICD-10-CM

## 2014-05-20 DIAGNOSIS — M47812 Spondylosis without myelopathy or radiculopathy, cervical region: Secondary | ICD-10-CM

## 2014-05-20 NOTE — Procedures (Signed)
     HISTORY:  Patricia Humblesamela Park is a 53 year old patient with a several month history of bilateral paresthesias of the hands that is present off and on throughout the day, worse when she elevates her arms. She has a history of prior cervical spine surgery, and she does report some chronic neck discomfort and headaches associated with this. The patient is being evaluated for possible neuropathy or a cervical radiculopathy.  NERVE CONDUCTION STUDIES:  Nerve conduction studies were performed on both upper extremities. The distal motor latencies and motor amplitudes for the median, radial, and ulnar nerves were within normal limits. The F wave latencies and nerve conduction velocities for the median and ulnar nerves were also normal. The sensory latencies for the median, radial, and ulnar nerves were normal.   EMG STUDIES:  EMG study was performed on the right upper extremity:  The first dorsal interosseous muscle reveals 2 to 4 K units with full recruitment. No fibrillations or positive waves were noted. The abductor pollicis brevis muscle reveals 2 to 4 K units with full recruitment. No fibrillations or positive waves were noted. The extensor indicis proprius muscle reveals 1 to 3 K units with full recruitment. No fibrillations or positive waves were noted. The pronator teres muscle reveals 2 to 3 K units with full recruitment. No fibrillations or positive waves were noted. The biceps muscle reveals 1 to 2 K units with full recruitment. No fibrillations or positive waves were noted. The triceps muscle reveals 2 to 4 K units with full recruitment. No fibrillations or positive waves were noted. The anterior deltoid muscle reveals 2 to 3 K units with full recruitment. No fibrillations or positive waves were noted. The cervical paraspinal muscles were tested at 2 levels. No abnormalities of insertional activity were seen at either level tested. There was good relaxation.  EMG study was performed on  the left upper extremity:  The first dorsal interosseous muscle reveals 2 to 4 K units with full recruitment. No fibrillations or positive waves were noted. The abductor pollicis brevis muscle reveals 2 to 5 K units with full recruitment. No fibrillations or positive waves were noted. The extensor indicis proprius muscle reveals 1 to 3 K units with full recruitment. No fibrillations or positive waves were noted. The pronator teres muscle reveals 2 to 3 K units with full recruitment. No fibrillations or positive waves were noted. The biceps muscle reveals 1 to 2 K units with full recruitment. No fibrillations or positive waves were noted. The triceps muscle reveals 2 to 4 K units with full recruitment. No fibrillations or positive waves were noted. The anterior deltoid muscle reveals 2 to 3 K units with full recruitment. No fibrillations or positive waves were noted. The cervical paraspinal muscles were tested at 2 levels. No abnormalities of insertional activity were seen at either level tested. There was good relaxation.   IMPRESSION:  Nerve conduction studies done on both upper extremities were within normal limits. No evidence of a neuropathy is seen. EMG evaluation of both upper extremities were normal bilaterally, without evidence of an overlying cervical radiculopathy on either side.  Marlan Palau. Keith Maleeya Peterkin MD 05/20/2014 12:04 PM  [facility] [address] [place], [place] [id number]  Phone [phone] Fax [phone]

## 2014-08-26 ENCOUNTER — Other Ambulatory Visit: Payer: Self-pay

## 2015-01-06 ENCOUNTER — Other Ambulatory Visit: Payer: Self-pay

## 2015-01-06 DIAGNOSIS — Z1231 Encounter for screening mammogram for malignant neoplasm of breast: Secondary | ICD-10-CM

## 2015-02-20 ENCOUNTER — Ambulatory Visit
Admission: RE | Admit: 2015-02-20 | Discharge: 2015-02-20 | Disposition: A | Discharge status: Home / Self Care | Payer: 59 | Source: Ambulatory Visit

## 2015-02-20 DIAGNOSIS — Z1231 Encounter for screening mammogram for malignant neoplasm of breast: Secondary | ICD-10-CM

## 2015-02-21 ENCOUNTER — Other Ambulatory Visit: Payer: Self-pay | Admitting: Family Medicine

## 2015-02-21 DIAGNOSIS — R928 Other abnormal and inconclusive findings on diagnostic imaging of breast: Secondary | ICD-10-CM

## 2015-02-27 ENCOUNTER — Ambulatory Visit
Admission: RE | Admit: 2015-02-27 | Discharge: 2015-02-27 | Disposition: A | Discharge status: Home / Self Care | Payer: 59 | Source: Ambulatory Visit | Attending: Family Medicine | Admitting: Family Medicine

## 2015-02-27 DIAGNOSIS — R928 Other abnormal and inconclusive findings on diagnostic imaging of breast: Secondary | ICD-10-CM

## 2015-03-07 ENCOUNTER — Other Ambulatory Visit: Payer: 59

## 2015-06-19 ENCOUNTER — Other Ambulatory Visit: Payer: Self-pay | Admitting: Neurosurgery

## 2015-06-19 DIAGNOSIS — M47812 Spondylosis without myelopathy or radiculopathy, cervical region: Secondary | ICD-10-CM

## 2015-06-21 ENCOUNTER — Ambulatory Visit
Admission: RE | Admit: 2015-06-21 | Discharge: 2015-06-21 | Disposition: A | Discharge status: Home / Self Care | Payer: 59 | Source: Ambulatory Visit | Attending: Neurosurgery | Admitting: Neurosurgery

## 2015-06-21 DIAGNOSIS — M47812 Spondylosis without myelopathy or radiculopathy, cervical region: Secondary | ICD-10-CM

## 2016-01-23 ENCOUNTER — Other Ambulatory Visit: Payer: Self-pay

## 2016-01-23 DIAGNOSIS — Z1231 Encounter for screening mammogram for malignant neoplasm of breast: Secondary | ICD-10-CM

## 2016-02-26 ENCOUNTER — Ambulatory Visit
Admission: RE | Admit: 2016-02-26 | Discharge: 2016-02-26 | Disposition: A | Discharge status: Home / Self Care | Payer: 59 | Source: Ambulatory Visit

## 2016-02-26 DIAGNOSIS — Z1231 Encounter for screening mammogram for malignant neoplasm of breast: Secondary | ICD-10-CM

## 2016-10-28 ENCOUNTER — Other Ambulatory Visit: Payer: Self-pay | Admitting: Neurosurgery

## 2016-10-28 DIAGNOSIS — M546 Pain in thoracic spine: Secondary | ICD-10-CM

## 2016-11-03 ENCOUNTER — Other Ambulatory Visit: Payer: 59

## 2016-11-03 DIAGNOSIS — E785 Hyperlipidemia, unspecified: Secondary | ICD-10-CM | POA: Insufficient documentation

## 2016-11-06 ENCOUNTER — Ambulatory Visit
Admission: RE | Admit: 2016-11-06 | Discharge: 2016-11-06 | Disposition: A | Discharge status: Home / Self Care | Payer: 59 | Source: Ambulatory Visit | Attending: Neurosurgery | Admitting: Neurosurgery

## 2016-11-06 DIAGNOSIS — M546 Pain in thoracic spine: Secondary | ICD-10-CM

## 2016-11-06 MED ORDER — GADOBENATE DIMEGLUMINE 529 MG/ML IV SOLN
15.0000 mL | Freq: Once | INTRAVENOUS | Status: AC | PRN
  Administered 2016-11-06: 15 mL via INTRAVENOUS

## 2016-11-08 ENCOUNTER — Other Ambulatory Visit: Payer: 59

## 2016-12-16 ENCOUNTER — Encounter: Payer: Self-pay | Admitting: Gastroenterology

## 2017-01-27 ENCOUNTER — Other Ambulatory Visit: Payer: Self-pay | Admitting: Physician Assistant

## 2017-01-27 DIAGNOSIS — Z1231 Encounter for screening mammogram for malignant neoplasm of breast: Secondary | ICD-10-CM

## 2017-03-03 ENCOUNTER — Ambulatory Visit
Admission: RE | Admit: 2017-03-03 | Discharge: 2017-03-03 | Disposition: A | Discharge status: Home / Self Care | Payer: 59 | Source: Ambulatory Visit | Attending: Physician Assistant | Admitting: Physician Assistant

## 2017-03-03 DIAGNOSIS — Z1231 Encounter for screening mammogram for malignant neoplasm of breast: Secondary | ICD-10-CM

## 2017-07-09 ENCOUNTER — Other Ambulatory Visit: Payer: Self-pay | Admitting: Nurse Practitioner

## 2017-07-09 ENCOUNTER — Ambulatory Visit
Admission: RE | Admit: 2017-07-09 | Discharge: 2017-07-09 | Disposition: A | Discharge status: Home / Self Care | Payer: 59 | Source: Ambulatory Visit | Attending: Nurse Practitioner | Admitting: Nurse Practitioner

## 2017-07-09 DIAGNOSIS — M4302 Spondylolysis, cervical region: Secondary | ICD-10-CM

## 2017-07-09 DIAGNOSIS — M810 Age-related osteoporosis without current pathological fracture: Secondary | ICD-10-CM | POA: Insufficient documentation

## 2017-07-09 DIAGNOSIS — M47816 Spondylosis without myelopathy or radiculopathy, lumbar region: Secondary | ICD-10-CM | POA: Insufficient documentation

## 2017-07-09 DIAGNOSIS — M5481 Occipital neuralgia: Secondary | ICD-10-CM | POA: Insufficient documentation

## 2017-07-10 ENCOUNTER — Ambulatory Visit (INDEPENDENT_AMBULATORY_CARE_PROVIDER_SITE_OTHER): Payer: 59

## 2017-07-10 ENCOUNTER — Encounter: Payer: Self-pay | Admitting: Podiatry

## 2017-07-10 ENCOUNTER — Other Ambulatory Visit: Payer: Self-pay | Admitting: Podiatry

## 2017-07-10 ENCOUNTER — Ambulatory Visit (INDEPENDENT_AMBULATORY_CARE_PROVIDER_SITE_OTHER): Payer: 59 | Admitting: Podiatry

## 2017-07-10 DIAGNOSIS — M779 Enthesopathy, unspecified: Secondary | ICD-10-CM

## 2017-07-10 DIAGNOSIS — M79674 Pain in right toe(s): Secondary | ICD-10-CM

## 2017-07-10 MED ORDER — TRIAMCINOLONE ACETONIDE 10 MG/ML IJ SUSP
10.0000 mg | Freq: Once | INTRAMUSCULAR | Status: AC
  Administered 2017-07-10: 10 mg

## 2017-07-10 NOTE — Progress Notes (Signed)
Subjective:    Patient ID: [name], female   DOB: 56 y.o.   MRN: [id number]   HPI patient states I've had a lot of pain in my right foot for around a month and I do think I bumped it prior to the pain starting    Review of Systems  All other systems reviewed and are negative.       Objective:  Physical Exam  Constitutional: She appears well-developed and well-nourished.  Cardiovascular: Intact distal pulses.   Musculoskeletal: Normal range of motion.  Neurological: She is alert.  Skin: Skin is warm.  Nursing note and vitals reviewed.  neurovascular status found to be intact muscle strength was adequate range of motion within normal limits with patient found to have inflammatory changes in the right third metatarsophalangeal joint and mild changes in the second metatarsophalangeal joint with fluid buildup around the joint surface and mild swelling. It is localized to this area and does not extend into the metatarsal itself and patient was found have good digital perfusion and well oriented 3     Assessment:    Inflammatory capsulitis of the third MPJ right with mild involvement second     Plan:    H&P condition reviewed and today I did a proximal nerve block of the right third MPJ I did discuss risk associated with procedure and I aspirated the third MPJ getting out of small amount of clear fluid and injected quarter cc dexamethasone Kenalog and applied thick plantar padding to take pressure off the joint surface. I then advised on reduced activity rigid bottom shoes and reappoint 2 weeks to recheck  X-rays indicate that there is no indications of joint damage or other pathology associated with this condition

## 2017-07-10 NOTE — Progress Notes (Signed)
   Subjective:    Patient ID: [name], female    DOB: [birth year], 56 y.o.   MRN: [id number]  HPI  Chief Complaint  Patient presents with  . Toe Pain    Rt foot 2nd and 3rd toe       Review of Systems  Cardiovascular: Positive for leg swelling.  Musculoskeletal: Positive for back pain.  Neurological: Positive for headaches.  Hematological: Bruises/bleeds easily.  All other systems reviewed and are negative.      Objective:   Physical Exam        Assessment & Plan:

## 2017-07-11 ENCOUNTER — Telehealth: Payer: Self-pay | Admitting: *Deleted

## 2017-07-11 ENCOUNTER — Encounter: Payer: 59 | Admitting: Gastroenterology

## 2017-07-11 NOTE — Telephone Encounter (Signed)
Pt states she has the same amount of swelling as she had after the injection from Dr. Charlsie Merlesegal yesterday, was that normal? I told pt that was not unusual

## 2017-07-31 ENCOUNTER — Ambulatory Visit: Payer: 59 | Admitting: Podiatry

## 2017-08-14 ENCOUNTER — Encounter: Payer: Self-pay | Admitting: Podiatry

## 2017-08-14 ENCOUNTER — Ambulatory Visit (INDEPENDENT_AMBULATORY_CARE_PROVIDER_SITE_OTHER): Payer: 59 | Admitting: Podiatry

## 2017-08-14 DIAGNOSIS — M779 Enthesopathy, unspecified: Secondary | ICD-10-CM

## 2017-08-14 DIAGNOSIS — M79674 Pain in right toe(s): Secondary | ICD-10-CM | POA: Diagnosis not present

## 2017-08-14 DIAGNOSIS — R600 Localized edema: Secondary | ICD-10-CM | POA: Diagnosis not present

## 2017-08-14 MED ORDER — DICLOFENAC SODIUM 75 MG PO TBEC: 75.0000 mg | DELAYED_RELEASE_TABLET | Freq: Two times a day (BID) | ORAL | 2 refills | Status: DC

## 2017-08-15 NOTE — Progress Notes (Signed)
Subjective:    Patient ID: [name], female   DOB: 56 y.o.   MRN: [id number]   HPI patient continues to have a lot of pain in the right foot and states the injection did not help her and it's occurring across the majority of her metatarsal joints and becoming gradually more irritative for her    ROS      Objective:  Physical Exam neurovascular status intact muscle strength adequate patient having quite a bit of discomfort in the metatarsal phalangeal joint fourth third and second right with dorsal pain and also moderate discomfort in the Achilles tendon. Patient also has edema in the forefoot and into the ankle with negative Homans sign   Assessment:    Appears to be a resisted inflammatory condition with no indications currently that there is any kind of clot     Plan:    H&P conditions reviewed and at this time due to the pain I did immobilize with air fracture walker with instructions on usage. Patient also we'll start oral anti-inflammatory diclofenac and I did dispense surgical stocking and reappoint in 4 weeks

## 2017-08-29 ENCOUNTER — Encounter: Payer: Self-pay | Admitting: Podiatry

## 2017-08-29 ENCOUNTER — Ambulatory Visit (INDEPENDENT_AMBULATORY_CARE_PROVIDER_SITE_OTHER): Payer: 59 | Admitting: Podiatry

## 2017-08-29 DIAGNOSIS — G5761 Lesion of plantar nerve, right lower limb: Secondary | ICD-10-CM | POA: Diagnosis not present

## 2017-08-29 DIAGNOSIS — G5781 Other specified mononeuropathies of right lower limb: Secondary | ICD-10-CM

## 2017-08-31 NOTE — Progress Notes (Signed)
  Subjective:  Patient ID: [name], female    DOB: 1961-04-27,  MRN: [id number]  Chief Complaint  Patient presents with  . Foot Pain    Follow up capsulitis right   "Monday it felt a little better, but nothing since then. Its still been really sore"   56 y.o. female returns for the above complaint. The ports that is feeling a little better since wearing the boot. States the area still sore.  Objective:  There were no vitals filed for this visit. General AA&O x3. Normal mood and affect.  Vascular Dorsalis pedis and posterior tibial pulses  present 2+ bilaterally  Capillary refill normal to all digits. Pedal hair growth normal.  Neurologic Epicritic sensation grossly present.  Dermatologic No open lesions. Interspaces clear of maceration.  Nails well groomed and normal in appearance.  Orthopedic: MMT 5/5 in dorsiflexion, plantarflexion, inversion, and eversion. Normal joint ROM without pain or crepitus. Positive Mulder's click right secondi nterspace  POP plantar 2nd/3rd metatarsal heads.   Assessment & Plan:  Patient was evaluated and treated and all questions answered.  Neuroma, R Foot -Injection delivered as below. -D/c CAM boot to patient tolerance. -Anti-inflammatory meds PRN.  Procedure: Neuroma Injection Location: Right 2nd interspace Skin Prep: Alcohol. Injectate: 0.5 cc 0.5% marcaine plain, 0.5 cc dexamethasone phosphate. Disposition: Patient tolerated procedure well. Injection site dressed with a band-aid.

## 2017-09-03 ENCOUNTER — Other Ambulatory Visit: Payer: Self-pay | Admitting: Nurse Practitioner

## 2017-09-03 DIAGNOSIS — M4302 Spondylolysis, cervical region: Secondary | ICD-10-CM

## 2017-09-04 ENCOUNTER — Ambulatory Visit: Payer: 59 | Admitting: Podiatry

## 2017-09-08 ENCOUNTER — Ambulatory Visit: Payer: 59 | Admitting: Podiatry

## 2017-09-14 ENCOUNTER — Ambulatory Visit
Admission: RE | Admit: 2017-09-14 | Discharge: 2017-09-14 | Disposition: A | Discharge status: Home / Self Care | Payer: 59 | Source: Ambulatory Visit | Attending: Nurse Practitioner | Admitting: Nurse Practitioner

## 2017-09-14 DIAGNOSIS — M4302 Spondylolysis, cervical region: Secondary | ICD-10-CM

## 2017-09-17 ENCOUNTER — Ambulatory Visit: Payer: 59 | Admitting: Podiatry

## 2017-10-09 ENCOUNTER — Ambulatory Visit: Payer: 59 | Admitting: Podiatry

## 2017-10-09 DIAGNOSIS — M779 Enthesopathy, unspecified: Secondary | ICD-10-CM

## 2017-10-09 DIAGNOSIS — G5761 Lesion of plantar nerve, right lower limb: Secondary | ICD-10-CM

## 2017-10-09 DIAGNOSIS — G5781 Other specified mononeuropathies of right lower limb: Secondary | ICD-10-CM

## 2017-10-10 NOTE — Progress Notes (Signed)
  Subjective:  Patient ID: [name], female    DOB: 1961/03/09,  MRN: [id number]  No chief complaint on file.  56 y.o. female returns for the above complaint.  States that her foot is doing much better.  No pain today.  Objective:  There were no vitals filed for this visit. General AA&O x3. Normal mood and affect.  Vascular Pedal pulses palpable.  Neurologic Epicritic sensation grossly intact.  Dermatologic No open lesions. Skin normal texture and turgor.  Orthopedic: No pain to palpation either foot.  There is clinical right third interspace   Assessment & Plan:  Patient was evaluated and treated and all questions answered.  Neuroma right foot -No Injection today. -Should neuroma persist would consider repeat injection  Return in about 6 weeks (around 11/20/2017) for neuroma f/u .

## 2017-11-20 ENCOUNTER — Ambulatory Visit: Payer: BLUE CROSS/BLUE SHIELD | Admitting: Podiatry

## 2017-11-20 DIAGNOSIS — G5781 Other specified mononeuropathies of right lower limb: Secondary | ICD-10-CM

## 2017-11-20 DIAGNOSIS — G5761 Lesion of plantar nerve, right lower limb: Secondary | ICD-10-CM

## 2017-11-20 DIAGNOSIS — M779 Enthesopathy, unspecified: Secondary | ICD-10-CM | POA: Diagnosis not present

## 2017-12-15 DIAGNOSIS — J019 Acute sinusitis, unspecified: Secondary | ICD-10-CM | POA: Diagnosis not present

## 2018-01-06 NOTE — Progress Notes (Signed)
  Subjective:  Patient ID: Elie Goodyamela M Douthitt, female    DOB: 10-05-1961,  MRN: [id number]  Chief Complaint  Patient presents with  . Foot Pain    6 week follow up  capsillitis    57 y.o. female returns for the above complaint.  States that she is doing much better.  Denies pain.  Objective:  There were no vitals filed for this visit. General AA&O x3. Normal mood and affect.  Vascular Pedal pulses palpable.  Neurologic Epicritic sensation grossly intact.  Dermatologic No open lesions. Skin normal texture and turgor.  Orthopedic: No pain to palpation either foot.   Assessment & Plan:  Patient was evaluated and treated and all questions answered.  Neuroma right foot -Appears resolved.  Discussed possible recurrence.  Patient to present for further evaluation should symptoms return.  Return if symptoms worsen or fail to improve.

## 2018-01-09 DIAGNOSIS — G43909 Migraine, unspecified, not intractable, without status migrainosus: Secondary | ICD-10-CM | POA: Diagnosis not present

## 2018-01-09 DIAGNOSIS — Z1331 Encounter for screening for depression: Secondary | ICD-10-CM | POA: Diagnosis not present

## 2018-01-09 DIAGNOSIS — R635 Abnormal weight gain: Secondary | ICD-10-CM | POA: Diagnosis not present

## 2018-01-09 DIAGNOSIS — Z79899 Other long term (current) drug therapy: Secondary | ICD-10-CM | POA: Diagnosis not present

## 2018-01-09 DIAGNOSIS — M810 Age-related osteoporosis without current pathological fracture: Secondary | ICD-10-CM | POA: Diagnosis not present

## 2018-01-09 DIAGNOSIS — E7800 Pure hypercholesterolemia, unspecified: Secondary | ICD-10-CM | POA: Diagnosis not present

## 2018-01-09 DIAGNOSIS — R609 Edema, unspecified: Secondary | ICD-10-CM | POA: Diagnosis not present

## 2018-01-13 DIAGNOSIS — M810 Age-related osteoporosis without current pathological fracture: Secondary | ICD-10-CM | POA: Diagnosis not present

## 2018-01-13 DIAGNOSIS — M4302 Spondylolysis, cervical region: Secondary | ICD-10-CM | POA: Diagnosis not present

## 2018-01-13 DIAGNOSIS — M5481 Occipital neuralgia: Secondary | ICD-10-CM | POA: Diagnosis not present

## 2018-01-13 DIAGNOSIS — Z6829 Body mass index (BMI) 29.0-29.9, adult: Secondary | ICD-10-CM | POA: Diagnosis not present

## 2018-02-19 ENCOUNTER — Ambulatory Visit (INDEPENDENT_AMBULATORY_CARE_PROVIDER_SITE_OTHER): Payer: BLUE CROSS/BLUE SHIELD

## 2018-02-19 ENCOUNTER — Ambulatory Visit: Payer: BLUE CROSS/BLUE SHIELD | Admitting: Podiatry

## 2018-02-19 DIAGNOSIS — M779 Enthesopathy, unspecified: Secondary | ICD-10-CM | POA: Diagnosis not present

## 2018-02-19 DIAGNOSIS — M659 Synovitis and tenosynovitis, unspecified: Secondary | ICD-10-CM | POA: Diagnosis not present

## 2018-02-19 DIAGNOSIS — M21961 Unspecified acquired deformity of right lower leg: Secondary | ICD-10-CM | POA: Diagnosis not present

## 2018-02-19 DIAGNOSIS — M79674 Pain in right toe(s): Secondary | ICD-10-CM

## 2018-02-19 NOTE — Patient Instructions (Signed)
Pre-Operative Instructions  Congratulations, you have decided to take an important step towards improving your quality of life.  You can be assured that the doctors and staff at [facility] will be with you every step of the way.  Here are some important things you should know:  1. Plan to be at the surgery center/hospital at least 1 (one) hour prior to your scheduled time, unless otherwise directed by the surgical center/hospital staff.  You must have a responsible adult accompany you, remain during the surgery and drive you home.  Make sure you have directions to the surgical center/hospital to ensure you arrive on time. 2. If you are having surgery at [facility] or [facility], you will need a copy of your medical history and physical form from your family physician within one month prior to the date of surgery. We will give you a form for your primary physician to complete.  3. We make every effort to accommodate the date you request for surgery.  However, there are times where surgery dates or times have to be moved.  We will contact you as soon as possible if a change in schedule is required.   4. No aspirin/ibuprofen for one week before surgery.  If you are on aspirin, any non-steroidal anti-inflammatory medications (Mobic, Aleve, Ibuprofen) should not be taken seven (7) days prior to your surgery.  You make take Tylenol for pain prior to surgery.  5. Medications - If you are taking daily heart and blood pressure medications, seizure, reflux, allergy, asthma, anxiety, pain or diabetes medications, make sure you notify the surgery center/hospital before the day of surgery so they can tell you which medications you should take or avoid the day of surgery. 6. No food or drink after midnight the night before surgery unless directed otherwise by surgical center/hospital staff. 7. No alcoholic beverages 24-hours prior to surgery.  No smoking 24-hours prior or 24-hours after  surgery. 8. Wear loose pants or shorts. They should be loose enough to fit over bandages, boots, and casts. 9. Don't wear slip-on shoes. Sneakers are preferred. 10. Bring your boot with you to the surgery center/hospital.  Also bring crutches or a walker if your physician has prescribed it for you.  If you do not have this equipment, it will be provided for you after surgery. 11. If you have not been contacted by the surgery center/hospital by the day before your surgery, call to confirm the date and time of your surgery. 12. Leave-time from work may vary depending on the type of surgery you have.  Appropriate arrangements should be made prior to surgery with your employer. 13. Prescriptions will be provided immediately following surgery by your doctor.  Fill these as soon as possible after surgery and take the medication as directed. Pain medications will not be refilled on weekends and must be approved by the doctor. 14. Remove nail polish on the operative foot and avoid getting pedicures prior to surgery. 15. Wash the night before surgery.  The night before surgery wash the foot and leg well with water and the antibacterial soap provided. Be sure to pay special attention to beneath the toenails and in between the toes.  Wash for at least three (3) minutes. Rinse thoroughly with water and dry well with a towel.  Perform this wash unless told not to do so by your physician.  Enclosed: 1 Ice pack (please put in freezer the night before surgery)   1 Hibiclens skin cleaner     Pre-op instructions  If you have any questions regarding the instructions, please do not hesitate to call our office.  [place]: [address], [place], [place] [address] -- [phone]  [place]: [address] [place], [place] [address] -- [phone]  [place]: [address]  , [place] [address] -- [phone]  [place]: [address], [place], [place] [address] -- [phone]  Website:  [email or web address] 

## 2018-02-20 ENCOUNTER — Telehealth: Payer: Self-pay | Admitting: *Deleted

## 2018-02-20 NOTE — Telephone Encounter (Signed)
"  I was in there yesterday and saw Dr. Samuella CotaPrice.  He scheduled me for surgery on 05/22.  I was wondering if I could get a copy of exactly what he's going to do.  I know I signed the papers yesterday but I need more information.  I have to call in at work and give them all the information for disability so they can go ahead and start that.  I'll be at work until 5 pm."

## 2018-02-26 ENCOUNTER — Other Ambulatory Visit: Payer: Self-pay | Admitting: Physician Assistant

## 2018-02-26 DIAGNOSIS — Z1231 Encounter for screening mammogram for malignant neoplasm of breast: Secondary | ICD-10-CM

## 2018-03-06 NOTE — Telephone Encounter (Signed)
"  I got your message.  I'm just returning your call.  I have a couple of questions about the surgery.  If you could, give me a call back.  I was just wondering about the ice pack, the crutches.  My sister and my daughter-in-law had a little scooter.  I was just wondering if it's possible to use that.  I just have some simple questions.  The surgery is scheduled for May 22 with Dr. Samuella CotaPrice.

## 2018-03-06 NOTE — Telephone Encounter (Signed)
I attempted to return her call.  I apologized for the late response.  I asked her to give me a call back.

## 2018-03-10 NOTE — Progress Notes (Signed)
  Subjective:  Patient ID: [name], female    DOB: Mar 20, 1961,  MRN: [id number]  Chief Complaint  Patient presents with  . Neuroma    F/U Rt neuroma Pt. stated," pain is worse (5/10), now it hurts when I am sitting." tx: taping, ice and heat   57 y.o. female returns for the above complaint.  Reports the pain is worse than it has been previously.  Reports pain at 5 out of 10.  Now it starts in when she is sitting.  Has been using heat and ice and taping.  Objective:  There were no vitals filed for this visit. General AA&O x3. Normal mood and affect.  Vascular Pedal pulses palpable.  Neurologic Epicritic sensation grossly intact.  Dermatologic No open lesions. Skin normal texture and turgor.  Orthopedic:  Pain palpation about the second third metatarsal heads right with positive Lockman test and pain on range of motion of second third MPJs   Assessment & Plan:  Patient was evaluated and treated and all questions answered.  Capsulitis second third metatarsals with possible plantar plate rupture -X-rays taken reviewed elongated second third metatarsals.  Discussed performing shortening metatarsal osteotomies with possible plantar plate repair.  All risk benefits and alternatives explained to consent reviewed and signed by patient.  Patient no guarantees given.  Patient would like to proceed.  Surgery to be determined by surgical coordinator.  Return for after surgery.

## 2018-03-12 ENCOUNTER — Other Ambulatory Visit: Payer: Self-pay | Admitting: Podiatry

## 2018-03-12 DIAGNOSIS — M779 Enthesopathy, unspecified: Secondary | ICD-10-CM

## 2018-03-19 NOTE — Telephone Encounter (Signed)
I attempted to call the patient again.  I left her messages to call me tomorrow.

## 2018-03-20 NOTE — Telephone Encounter (Signed)
"  We've been playing phone tag.  I had a few questions.  He had mentioned non-weight bearing.  Is that just for the day of surgery or will it be longer?"  You are having a possible plantar plate ligament repair.  That's on the bottom of your foot.  So, you will probably non-weight bearing for a few weeks.  "My daughter-in-law had surgery and had to use a knee scooter.  Is it okay for me to use something like that or will it cause blood to rush to my toes?"  You can use the knee scooter.  Do I need to order you one?  "No, I think I will just use her knee scooter.  I think one in the family is suffice.  I still haven't heard from anyone about a time."    "Thanks for you help"

## 2018-03-30 ENCOUNTER — Ambulatory Visit
Admission: RE | Admit: 2018-03-30 | Discharge: 2018-03-30 | Disposition: A | Discharge status: Home / Self Care | Payer: BLUE CROSS/BLUE SHIELD | Source: Ambulatory Visit | Attending: Physician Assistant | Admitting: Physician Assistant

## 2018-03-30 DIAGNOSIS — Z1231 Encounter for screening mammogram for malignant neoplasm of breast: Secondary | ICD-10-CM | POA: Diagnosis not present

## 2018-04-01 ENCOUNTER — Other Ambulatory Visit: Payer: Self-pay | Admitting: Podiatry

## 2018-04-01 ENCOUNTER — Telehealth: Payer: Self-pay | Admitting: *Deleted

## 2018-04-01 ENCOUNTER — Encounter: Payer: Self-pay | Admitting: Podiatry

## 2018-04-01 DIAGNOSIS — M7751 Other enthesopathy of right foot: Secondary | ICD-10-CM | POA: Diagnosis not present

## 2018-04-01 DIAGNOSIS — M205X1 Other deformities of toe(s) (acquired), right foot: Secondary | ICD-10-CM | POA: Diagnosis not present

## 2018-04-01 DIAGNOSIS — M2041 Other hammer toe(s) (acquired), right foot: Secondary | ICD-10-CM | POA: Diagnosis not present

## 2018-04-01 DIAGNOSIS — G43909 Migraine, unspecified, not intractable, without status migrainosus: Secondary | ICD-10-CM | POA: Diagnosis not present

## 2018-04-01 DIAGNOSIS — M216X1 Other acquired deformities of right foot: Secondary | ICD-10-CM | POA: Diagnosis not present

## 2018-04-01 DIAGNOSIS — M7741 Metatarsalgia, right foot: Secondary | ICD-10-CM | POA: Diagnosis not present

## 2018-04-01 DIAGNOSIS — M21541 Acquired clubfoot, right foot: Secondary | ICD-10-CM | POA: Diagnosis not present

## 2018-04-01 DIAGNOSIS — M25571 Pain in right ankle and joints of right foot: Secondary | ICD-10-CM | POA: Diagnosis not present

## 2018-04-01 MED ORDER — PROMETHAZINE HCL 25 MG PO TABS: 25.0000 mg | ORAL_TABLET | Freq: Three times a day (TID) | ORAL | 0 refills | Status: DC | PRN

## 2018-04-01 MED ORDER — OXYCODONE-ACETAMINOPHEN 10-325 MG PO TABS: 1.0000 | ORAL_TABLET | ORAL | 0 refills | Status: DC | PRN

## 2018-04-01 MED ORDER — CLINDAMYCIN HCL 150 MG PO CAPS: 150.0000 mg | ORAL_CAPSULE | Freq: Two times a day (BID) | ORAL | 0 refills | Status: DC

## 2018-04-01 NOTE — Telephone Encounter (Signed)
[name] - [facility] states Dr. Samuella Cota ordered Percocet 10/325mg  #20, and pt already has hydrocodone 5/325mg , what is pt to do? I told [name] to have pt not use the hydrocodone, she will need the pain coverage of the percocet because she had foot surgery today. [name] states he will inform the pt.

## 2018-04-01 NOTE — Progress Notes (Signed)
Patient presented for planned outpatient surgery today. Rx sent to pharmacy.

## 2018-04-03 ENCOUNTER — Ambulatory Visit (INDEPENDENT_AMBULATORY_CARE_PROVIDER_SITE_OTHER): Payer: BLUE CROSS/BLUE SHIELD

## 2018-04-03 ENCOUNTER — Encounter: Payer: Self-pay | Admitting: Podiatry

## 2018-04-03 ENCOUNTER — Ambulatory Visit (INDEPENDENT_AMBULATORY_CARE_PROVIDER_SITE_OTHER): Payer: Self-pay | Admitting: Podiatry

## 2018-04-03 DIAGNOSIS — M779 Enthesopathy, unspecified: Secondary | ICD-10-CM | POA: Diagnosis not present

## 2018-04-03 DIAGNOSIS — M659 Synovitis and tenosynovitis, unspecified: Secondary | ICD-10-CM

## 2018-04-03 NOTE — Progress Notes (Signed)
DOS  04/01/2018   Right foot second and third metatarsal osteotomies.

## 2018-04-06 NOTE — Progress Notes (Signed)
Subjective:   Patient ID: [name], female   DOB: 57 y.o.   MRN: [id number]   HPI Patient presents stating doing very well with surgery with minimal discomfort and I am nonweightbearing using my rollabout and I get swelling but it does not seem bad.  Patient has been compliant   ROS      Objective:  Physical Exam  Neurovascular status intact negative Homans sign noted with wound edges healed well good alignment with pin in place second toe in good alignment of the second toe with no indications of pathology.  There is moderate forefoot swelling consistent with this.  Of the postoperative process     Assessment:  Doing well post osteotomy right second and third metatarsal and digital stabilization with pin second toe right     Plan:  H&P conditions reviewed x-rays reviewed with patient.  Sterile dressing reapplied continue immobilization elevation compression and reappoint for visit in the next week and gave instructions to call if any issues should occur  X-rays indicate the osteotomy is healing well the metatarsals are in place digit in good alignment and stabilized across the metatarsal head

## 2018-04-10 ENCOUNTER — Encounter: Payer: Self-pay | Admitting: Podiatry

## 2018-04-10 ENCOUNTER — Ambulatory Visit (INDEPENDENT_AMBULATORY_CARE_PROVIDER_SITE_OTHER): Payer: Self-pay | Admitting: Podiatry

## 2018-04-10 DIAGNOSIS — M21961 Unspecified acquired deformity of right lower leg: Secondary | ICD-10-CM

## 2018-04-10 DIAGNOSIS — Z9889 Other specified postprocedural states: Secondary | ICD-10-CM

## 2018-04-10 DIAGNOSIS — M779 Enthesopathy, unspecified: Secondary | ICD-10-CM

## 2018-04-10 MED ORDER — OXYCODONE-ACETAMINOPHEN 10-325 MG PO TABS: 1.0000 | ORAL_TABLET | ORAL | 0 refills | Status: DC | PRN

## 2018-04-10 NOTE — Progress Notes (Signed)
  Subjective:  Patient ID: [name], female    DOB: 1961/08/05,  MRN: [id number]  Chief Complaint  Patient presents with  . Routine Post Op    " My foot still hurts quite a bit and now I am having a burning pain on the ball of my foot"     DOS: 04/01/18 Procedure: Weil osteotomies right second third metatarsals with pinning of right second metatarsal  57 y.o. female returns for post-op check. Denies N/V/F/Ch. Pain is controlled with current medications.  Objective:   General AA&O x3. Normal mood and affect.  Vascular Foot warm and well perfused.  Neurologic Gross sensation intact.  Dermatologic Skin healing well without signs of infection. Skin edges well coapted without signs of infection.  Orthopedic: Tenderness to palpation noted about the surgical site.    Assessment & Plan:  Patient was evaluated and treated and all questions answered.  S/p Weil osteotomies right second third metatarsals with pinning of right second metatarsal -Progressing as expected post-operatively. -Sutures: intact. -Medications refilled: Percocet -Foot redressed. -Continue nonweightbearing  Follow-up in 2 weeks with new x-rays for possible pin removal  No follow-ups on file.

## 2018-04-23 ENCOUNTER — Ambulatory Visit (INDEPENDENT_AMBULATORY_CARE_PROVIDER_SITE_OTHER): Payer: BLUE CROSS/BLUE SHIELD | Admitting: Podiatry

## 2018-04-23 ENCOUNTER — Ambulatory Visit (INDEPENDENT_AMBULATORY_CARE_PROVIDER_SITE_OTHER): Payer: BLUE CROSS/BLUE SHIELD

## 2018-04-23 DIAGNOSIS — M21961 Unspecified acquired deformity of right lower leg: Secondary | ICD-10-CM

## 2018-04-24 ENCOUNTER — Telehealth: Payer: Self-pay

## 2018-04-24 ENCOUNTER — Telehealth: Payer: Self-pay | Admitting: Podiatry

## 2018-04-24 NOTE — Telephone Encounter (Signed)
I saw Dr. Samuella CotaPrice yesterday for a post op visit and he removed a pin out of my toe. Maybe I was up on it too much yesterday as he said to go ahead and put weight on it but didn't say how much or how long. Everyone of my toes hurt all night long and I'm down to just one pain pill. I need a refill on that but it may have to be approved by my insurance company. I will contact my pharmacy to either contact your office or my insurance about the medication. If you would give me a call back and let me know if that pain is normal when you start putting weight on it. My number is [phone]. Thank you.

## 2018-04-24 NOTE — Telephone Encounter (Signed)
Spoke with patient regarding pain post pin removal, advised her to take it easy and gradually increase activities as tolerated. She can also take ibuprofen for pain and ice as needed.

## 2018-05-03 NOTE — Progress Notes (Signed)
  Subjective:  Patient ID: Elie Goodyamela M Hoeppner, female    DOB: [birth year],  MRN: [id number]  Chief Complaint  Patient presents with  . Routine Post Op    DOS: 04/01/18 Procedure: Weil osteotomies right second third metatarsals with pinning of right second metatarsal  57 y.o. female returns for post-op check. Denies N/V/F/Ch.  Still having pain.  Here today  Objective:   General AA&O x3. Normal mood and affect.  Vascular Foot warm and well perfused.  Neurologic Gross sensation intact.  Dermatologic Skin healing well without signs of infection. Skin edges well coapted without signs of infection.  Orthopedic: Tenderness to palpation noted about the surgical site.    Assessment & Plan:  Patient was evaluated and treated and all questions answered.  S/p Weil osteotomies right second third metatarsals with pinning of right second metatarsal -Progressing as expected post-operatively. -X-rays taken and reviewed healing appropriately. -Pin pulled in toto today in office. -Foot redressed. -Start weightbearing in boot  Return in about 2 weeks (around 05/07/2018) for Post-op.

## 2018-05-07 ENCOUNTER — Other Ambulatory Visit: Payer: Self-pay | Admitting: Podiatry

## 2018-05-07 ENCOUNTER — Ambulatory Visit (INDEPENDENT_AMBULATORY_CARE_PROVIDER_SITE_OTHER): Payer: BLUE CROSS/BLUE SHIELD | Admitting: Podiatry

## 2018-05-07 ENCOUNTER — Ambulatory Visit (INDEPENDENT_AMBULATORY_CARE_PROVIDER_SITE_OTHER): Payer: BLUE CROSS/BLUE SHIELD

## 2018-05-07 ENCOUNTER — Encounter: Payer: Self-pay | Admitting: Podiatry

## 2018-05-07 DIAGNOSIS — M779 Enthesopathy, unspecified: Secondary | ICD-10-CM

## 2018-05-07 DIAGNOSIS — M79671 Pain in right foot: Secondary | ICD-10-CM

## 2018-05-07 DIAGNOSIS — M21961 Unspecified acquired deformity of right lower leg: Secondary | ICD-10-CM

## 2018-05-07 DIAGNOSIS — Z9889 Other specified postprocedural states: Secondary | ICD-10-CM | POA: Diagnosis not present

## 2018-05-07 NOTE — Progress Notes (Signed)
  Subjective:  Patient ID: [name], female    DOB: [birth year],  MRN: [id number]  Chief Complaint  Patient presents with  . Routine Post Op    DOS (04-01-2018)  Metatarsal Osteotomy 2,3 Rt; Hammertoe Repair w/ Pin possible 2,3 Rt; Poss Plantar Plate Ligament Repair Rt; pt stated, "still little painful on side of foot (lateral) and when I'm on my heel foot turns purple and looks like it's going to explode; but it looks better than last week"    DOS: 04/01/18 Procedure: Weil osteotomies right second third metatarsals with pinning of right second metatarsal  57 y.o. female returns for post-op check. Denies N/V/F/Ch.  Still having pain and swelling, worst at the end of the day.  Objective:   General AA&O x3. Normal mood and affect.  Vascular Foot warm and well perfused.  Neurologic Gross sensation intact.  Dermatologic Skin healing well healed.  Orthopedic: Tenderness to palpation noted about the surgical site.    Assessment & Plan:  Patient was evaluated and treated and all questions answered.  S/p Weil osteotomies right second third metatarsals with pinning of right second metatarsal -Progressing as expected post-operatively. -X-rays taken and reviewed healing appropriately hardware intact. -Compression sleeve for swelling. -Surgical shoe dispensed.  Return in about 6 days (around 05/13/2018) for Post-op.

## 2018-05-13 ENCOUNTER — Ambulatory Visit (INDEPENDENT_AMBULATORY_CARE_PROVIDER_SITE_OTHER): Payer: BLUE CROSS/BLUE SHIELD | Admitting: Podiatry

## 2018-05-13 ENCOUNTER — Other Ambulatory Visit: Payer: Self-pay | Admitting: Podiatry

## 2018-05-13 ENCOUNTER — Ambulatory Visit (INDEPENDENT_AMBULATORY_CARE_PROVIDER_SITE_OTHER): Payer: BLUE CROSS/BLUE SHIELD

## 2018-05-13 ENCOUNTER — Ambulatory Visit: Payer: BLUE CROSS/BLUE SHIELD

## 2018-05-13 ENCOUNTER — Encounter: Payer: Self-pay | Admitting: Podiatry

## 2018-05-13 DIAGNOSIS — M2041 Other hammer toe(s) (acquired), right foot: Secondary | ICD-10-CM | POA: Diagnosis not present

## 2018-05-13 MED ORDER — MELOXICAM 15 MG PO TABS: 15.0000 mg | ORAL_TABLET | Freq: Every day | ORAL | 0 refills | Status: DC

## 2018-05-28 ENCOUNTER — Encounter: Payer: BLUE CROSS/BLUE SHIELD | Admitting: Podiatry

## 2018-06-03 DIAGNOSIS — Z6828 Body mass index (BMI) 28.0-28.9, adult: Secondary | ICD-10-CM | POA: Diagnosis not present

## 2018-06-03 DIAGNOSIS — M5481 Occipital neuralgia: Secondary | ICD-10-CM | POA: Diagnosis not present

## 2018-06-03 DIAGNOSIS — M4302 Spondylolysis, cervical region: Secondary | ICD-10-CM | POA: Diagnosis not present

## 2018-06-04 ENCOUNTER — Ambulatory Visit (INDEPENDENT_AMBULATORY_CARE_PROVIDER_SITE_OTHER): Payer: BLUE CROSS/BLUE SHIELD | Admitting: Podiatry

## 2018-06-04 ENCOUNTER — Encounter: Payer: Self-pay | Admitting: Podiatry

## 2018-06-04 DIAGNOSIS — Z9889 Other specified postprocedural states: Secondary | ICD-10-CM

## 2018-06-05 ENCOUNTER — Telehealth: Payer: Self-pay | Admitting: *Deleted

## 2018-06-05 DIAGNOSIS — Z9889 Other specified postprocedural states: Secondary | ICD-10-CM

## 2018-06-05 DIAGNOSIS — M2041 Other hammer toe(s) (acquired), right foot: Secondary | ICD-10-CM

## 2018-06-05 NOTE — Telephone Encounter (Signed)
PT Eval and treat  Work on scar tissue massage, ROM 2nd MPJ. Modalities PRN 2-3 sessions/week x 6 weeks.

## 2018-06-05 NOTE — Telephone Encounter (Signed)
[facility] rx, requesting to be scheduled for PT. Routed message to Dr. Samuella CotaPrice for orders.

## 2018-06-09 NOTE — Progress Notes (Signed)
  Subjective:  Patient ID: [name], female    DOB: 1961/09/19,  MRN: [id number]  Chief Complaint  Patient presents with  . Routine Post Op    i had surgery on 04-01-18 and my right foot is still swelling and i do wear the anklet    DOS: 04/01/18 Procedure: Weil osteotomies right second third metatarsals with pinning of right second metatarsal  57 y.o. female returns for post-op check. Denies N/V/F/Ch. Still having swelling. Objective:   General AA&O x3. Normal mood and affect.  Vascular Foot warm and well perfused.  Neurologic Gross sensation intact.  Dermatologic Skin healing well healed.  Orthopedic: Tenderness to palpation noted about the surgical site.    Assessment & Plan:  Patient was evaluated and treated and all questions answered.  S/p Weil osteotomies right second third metatarsals with pinning of right second metatarsal -Progressing as expected post-operatively. -Rx Meloxicam -Compression sleeve for swelling. -Surgical shoe dispensed.  Return for 2 weeks with Dr Samuella CotaPrice.

## 2018-06-09 NOTE — Progress Notes (Signed)
  Subjective:  Patient ID: [name], female    DOB: 12/16/1960,  MRN: [id number]  Chief Complaint  Patient presents with  . Routine Post Op     2wk follow up/dos 04/01/2018    DOS: 04/01/18 Procedure: Weil osteotomies right second third metatarsals with pinning of right second metatarsal  57 y.o. female returns for post-op check. Denies N/V/F/Ch. Still having some pain in the 2nd toe.  Objective:   General AA&O x3. Normal mood and affect.  Vascular Foot warm and well perfused.  Neurologic Gross sensation intact.  Dermatologic Skin healing well healed.  Orthopedic: Tenderness to palpation noted about the surgical site.    Assessment & Plan:  Patient was evaluated and treated and all questions answered.  S/p Weil osteotomies right second third metatarsals with pinning of right second metatarsal -Still having some pain. -Refer to PT. -No XR today. -Continue surgical shoe for 2 weeks.  Return in about 2 weeks (around 06/18/2018) for Post-op.

## 2018-06-10 DIAGNOSIS — M2041 Other hammer toe(s) (acquired), right foot: Secondary | ICD-10-CM | POA: Diagnosis not present

## 2018-06-10 DIAGNOSIS — Z9889 Other specified postprocedural states: Secondary | ICD-10-CM | POA: Diagnosis not present

## 2018-06-10 DIAGNOSIS — M25571 Pain in right ankle and joints of right foot: Secondary | ICD-10-CM | POA: Diagnosis not present

## 2018-06-10 DIAGNOSIS — M25671 Stiffness of right ankle, not elsewhere classified: Secondary | ICD-10-CM | POA: Diagnosis not present

## 2018-06-12 DIAGNOSIS — Z9889 Other specified postprocedural states: Secondary | ICD-10-CM | POA: Diagnosis not present

## 2018-06-12 DIAGNOSIS — M25571 Pain in right ankle and joints of right foot: Secondary | ICD-10-CM | POA: Diagnosis not present

## 2018-06-12 DIAGNOSIS — M25671 Stiffness of right ankle, not elsewhere classified: Secondary | ICD-10-CM | POA: Diagnosis not present

## 2018-06-12 DIAGNOSIS — M2041 Other hammer toe(s) (acquired), right foot: Secondary | ICD-10-CM | POA: Diagnosis not present

## 2018-06-15 DIAGNOSIS — M25571 Pain in right ankle and joints of right foot: Secondary | ICD-10-CM | POA: Diagnosis not present

## 2018-06-15 DIAGNOSIS — Z9889 Other specified postprocedural states: Secondary | ICD-10-CM | POA: Diagnosis not present

## 2018-06-15 DIAGNOSIS — M2041 Other hammer toe(s) (acquired), right foot: Secondary | ICD-10-CM | POA: Diagnosis not present

## 2018-06-15 DIAGNOSIS — M25671 Stiffness of right ankle, not elsewhere classified: Secondary | ICD-10-CM | POA: Diagnosis not present

## 2018-06-17 DIAGNOSIS — M25671 Stiffness of right ankle, not elsewhere classified: Secondary | ICD-10-CM | POA: Diagnosis not present

## 2018-06-17 DIAGNOSIS — M25571 Pain in right ankle and joints of right foot: Secondary | ICD-10-CM | POA: Diagnosis not present

## 2018-06-17 DIAGNOSIS — Z9889 Other specified postprocedural states: Secondary | ICD-10-CM | POA: Diagnosis not present

## 2018-06-17 DIAGNOSIS — M2041 Other hammer toe(s) (acquired), right foot: Secondary | ICD-10-CM | POA: Diagnosis not present

## 2018-06-19 ENCOUNTER — Ambulatory Visit (INDEPENDENT_AMBULATORY_CARE_PROVIDER_SITE_OTHER): Payer: BLUE CROSS/BLUE SHIELD | Admitting: Podiatry

## 2018-06-19 ENCOUNTER — Encounter: Payer: Self-pay | Admitting: Podiatry

## 2018-06-19 DIAGNOSIS — Z9889 Other specified postprocedural states: Secondary | ICD-10-CM | POA: Diagnosis not present

## 2018-06-19 DIAGNOSIS — M25571 Pain in right ankle and joints of right foot: Secondary | ICD-10-CM | POA: Diagnosis not present

## 2018-06-19 DIAGNOSIS — M2041 Other hammer toe(s) (acquired), right foot: Secondary | ICD-10-CM | POA: Diagnosis not present

## 2018-06-19 DIAGNOSIS — M25671 Stiffness of right ankle, not elsewhere classified: Secondary | ICD-10-CM | POA: Diagnosis not present

## 2018-06-22 DIAGNOSIS — M2041 Other hammer toe(s) (acquired), right foot: Secondary | ICD-10-CM | POA: Diagnosis not present

## 2018-06-22 DIAGNOSIS — M25671 Stiffness of right ankle, not elsewhere classified: Secondary | ICD-10-CM | POA: Diagnosis not present

## 2018-06-22 DIAGNOSIS — M25571 Pain in right ankle and joints of right foot: Secondary | ICD-10-CM | POA: Diagnosis not present

## 2018-06-22 DIAGNOSIS — Z9889 Other specified postprocedural states: Secondary | ICD-10-CM | POA: Diagnosis not present

## 2018-06-24 DIAGNOSIS — M25671 Stiffness of right ankle, not elsewhere classified: Secondary | ICD-10-CM | POA: Diagnosis not present

## 2018-06-24 DIAGNOSIS — M25571 Pain in right ankle and joints of right foot: Secondary | ICD-10-CM | POA: Diagnosis not present

## 2018-06-24 DIAGNOSIS — M2041 Other hammer toe(s) (acquired), right foot: Secondary | ICD-10-CM | POA: Diagnosis not present

## 2018-06-24 DIAGNOSIS — Z9889 Other specified postprocedural states: Secondary | ICD-10-CM | POA: Diagnosis not present

## 2018-06-26 DIAGNOSIS — M25571 Pain in right ankle and joints of right foot: Secondary | ICD-10-CM | POA: Diagnosis not present

## 2018-06-26 DIAGNOSIS — M2041 Other hammer toe(s) (acquired), right foot: Secondary | ICD-10-CM | POA: Diagnosis not present

## 2018-06-26 DIAGNOSIS — Z9889 Other specified postprocedural states: Secondary | ICD-10-CM | POA: Diagnosis not present

## 2018-06-26 DIAGNOSIS — M25671 Stiffness of right ankle, not elsewhere classified: Secondary | ICD-10-CM | POA: Diagnosis not present

## 2018-06-29 DIAGNOSIS — M25571 Pain in right ankle and joints of right foot: Secondary | ICD-10-CM | POA: Diagnosis not present

## 2018-06-29 DIAGNOSIS — Z9889 Other specified postprocedural states: Secondary | ICD-10-CM | POA: Diagnosis not present

## 2018-06-29 DIAGNOSIS — M25671 Stiffness of right ankle, not elsewhere classified: Secondary | ICD-10-CM | POA: Diagnosis not present

## 2018-06-29 DIAGNOSIS — M2041 Other hammer toe(s) (acquired), right foot: Secondary | ICD-10-CM | POA: Diagnosis not present

## 2018-07-01 DIAGNOSIS — Z9889 Other specified postprocedural states: Secondary | ICD-10-CM | POA: Diagnosis not present

## 2018-07-01 DIAGNOSIS — M25671 Stiffness of right ankle, not elsewhere classified: Secondary | ICD-10-CM | POA: Diagnosis not present

## 2018-07-01 DIAGNOSIS — M2041 Other hammer toe(s) (acquired), right foot: Secondary | ICD-10-CM | POA: Diagnosis not present

## 2018-07-01 DIAGNOSIS — M25571 Pain in right ankle and joints of right foot: Secondary | ICD-10-CM | POA: Diagnosis not present

## 2018-07-03 ENCOUNTER — Encounter: Payer: Self-pay | Admitting: Podiatry

## 2018-07-03 ENCOUNTER — Ambulatory Visit (INDEPENDENT_AMBULATORY_CARE_PROVIDER_SITE_OTHER): Payer: BLUE CROSS/BLUE SHIELD | Admitting: Podiatry

## 2018-07-03 DIAGNOSIS — M779 Enthesopathy, unspecified: Secondary | ICD-10-CM

## 2018-07-03 DIAGNOSIS — M2041 Other hammer toe(s) (acquired), right foot: Secondary | ICD-10-CM

## 2018-07-03 DIAGNOSIS — Z9889 Other specified postprocedural states: Secondary | ICD-10-CM

## 2018-07-03 DIAGNOSIS — M21961 Unspecified acquired deformity of right lower leg: Secondary | ICD-10-CM

## 2018-07-03 DIAGNOSIS — M659 Synovitis and tenosynovitis, unspecified: Secondary | ICD-10-CM

## 2018-07-03 NOTE — Progress Notes (Signed)
Subjective:  Patient ID: [name], female    DOB: 1961/04/18,  MRN: [id number]  Chief Complaint  Patient presents with  . Routine Post Op    doing much better - PT is helping   DOS: 04/01/18 Procedure: Weil osteotomies right second third metatarsals with pinning of right second metatarsal  57 y.o. female returns for post-op check.  She is doing much better PT is very much hoping  Review of Systems: Negative except as noted in the HPI. Denies N/V/F/Ch.  No past medical history on file.  Current Outpatient Medications:  .  albuterol (PROAIR HFA) 108 (90 Base) MCG/ACT inhaler, ProAir HFA 90 mcg/actuation aerosol inhaler  TAKE 2 PUFFS BY MOUTH EVERY 4 HOURS, Disp: , Rfl:  .  alendronate (FOSAMAX) 70 MG tablet, alendronate 70 mg tablet, Disp: , Rfl:  .  Calcium Carb-Cholecalciferol 500-400 MG-UNIT CHEW, Chew by mouth., Disp: , Rfl:  .  clindamycin (CLEOCIN) 150 MG capsule, Take 1 capsule (150 mg total) by mouth 2 (two) times daily., Disp: 14 capsule, Rfl: 0 .  cyclobenzaprine (FLEXERIL) 10 MG tablet, Take 10 mg by mouth., Disp: , Rfl:  .  diclofenac (VOLTAREN) 75 MG EC tablet, Take 1 tablet (75 mg total) by mouth 2 (two) times daily., Disp: 50 tablet, Rfl: 2 .  fluticasone (FLONASE) 50 MCG/ACT nasal spray, fluticasone propionate 50 mcg/actuation nasal spray,suspension  Spray 1 spray every day by intranasal route as directed for 10 days., Disp: , Rfl:  .  furosemide (LASIX) 40 MG tablet, Take 40 mg by mouth as needed., Disp: , Rfl:  .  gabapentin (NEURONTIN) 300 MG capsule, Take by mouth., Disp: , Rfl:  .  HYDROcodone-acetaminophen (NORCO/VICODIN) 5-325 MG tablet, Take by mouth. For back, Disp: , Rfl:  .  Influenza vac split quadrivalent PF (FLUZONE HIGH-DOSE) 0.5 ML injection, Inject into the muscle., Disp: , Rfl:  .  meloxicam (MOBIC) 15 MG tablet, Take 1 tablet (15 mg total) by mouth daily., Disp: 30 tablet, Rfl: 0 .  oxyCODONE-acetaminophen (PERCOCET) 10-325 MG tablet, Take 1  tablet by mouth every 4 (four) hours as needed for pain., Disp: 20 tablet, Rfl: 0 .  promethazine (PHENERGAN) 25 MG tablet, Take 1 tablet (25 mg total) by mouth every 8 (eight) hours as needed for nausea or vomiting., Disp: 20 tablet, Rfl: 0 .  rizatriptan (MAXALT) 10 MG tablet, Take 10 mg by mouth., Disp: , Rfl:  .  rosuvastatin (CRESTOR) 5 MG tablet, Take by mouth., Disp: , Rfl:  .  simvastatin (ZOCOR) 20 MG tablet, simvastatin 20 mg tablet, Disp: , Rfl:   Social History   Tobacco Use  Smoking Status Never Smoker  Smokeless Tobacco Never Used    Allergies  Allergen Reactions  . Demerol  [Meperidine Hcl]   . Erythromycin Base   . Meperidine   . Amoxicillin-Pot Clavulanate Nausea And Vomiting  . Latex Rash  . Tape Rash   Objective:  There were no vitals filed for this visit. There is no height or weight on file to calculate BMI. Constitutional Well developed. Well nourished.  Vascular Foot warm and well perfused. Capillary refill normal to all digits.   Neurologic Normal speech. Oriented to person, place, and time. Epicritic sensation to light touch grossly present bilaterally.  Dermatologic Skin healing well without signs of infection. Skin edges well coapted without signs of infection.  Orthopedic: No tenderness to palpation noted about the surgical site.   Radiographs: None today Assessment:   1. Post-operative state  2. Hammer toe of right foot   3. Deformity of metatarsal bone of right foot   4. Synovitis and tenosynovitis   5. Capsulitis    Plan:  Patient was evaluated and treated and all questions answered.  S/p foot surgery right -Progressing as expected post-operatively. -XR: None today -WB Status: Weight-bear as tolerated tolerated in normal shoe gear -Would benefit from custom orthotics.  Return in about 4 weeks (around 07/31/2018) for Post-op.

## 2018-07-06 DIAGNOSIS — M25571 Pain in right ankle and joints of right foot: Secondary | ICD-10-CM | POA: Diagnosis not present

## 2018-07-06 DIAGNOSIS — Z9889 Other specified postprocedural states: Secondary | ICD-10-CM | POA: Diagnosis not present

## 2018-07-06 DIAGNOSIS — M2041 Other hammer toe(s) (acquired), right foot: Secondary | ICD-10-CM | POA: Diagnosis not present

## 2018-07-06 DIAGNOSIS — M25671 Stiffness of right ankle, not elsewhere classified: Secondary | ICD-10-CM | POA: Diagnosis not present

## 2018-07-07 DIAGNOSIS — M810 Age-related osteoporosis without current pathological fracture: Secondary | ICD-10-CM | POA: Diagnosis not present

## 2018-07-07 DIAGNOSIS — E7800 Pure hypercholesterolemia, unspecified: Secondary | ICD-10-CM | POA: Diagnosis not present

## 2018-07-07 DIAGNOSIS — R609 Edema, unspecified: Secondary | ICD-10-CM | POA: Diagnosis not present

## 2018-07-07 DIAGNOSIS — G43909 Migraine, unspecified, not intractable, without status migrainosus: Secondary | ICD-10-CM | POA: Diagnosis not present

## 2018-07-09 DIAGNOSIS — M2041 Other hammer toe(s) (acquired), right foot: Secondary | ICD-10-CM | POA: Diagnosis not present

## 2018-07-09 DIAGNOSIS — M25571 Pain in right ankle and joints of right foot: Secondary | ICD-10-CM | POA: Diagnosis not present

## 2018-07-09 DIAGNOSIS — Z9889 Other specified postprocedural states: Secondary | ICD-10-CM | POA: Diagnosis not present

## 2018-07-09 DIAGNOSIS — M25671 Stiffness of right ankle, not elsewhere classified: Secondary | ICD-10-CM | POA: Diagnosis not present

## 2018-07-11 NOTE — Progress Notes (Signed)
  Subjective:  Patient ID: [name], female    DOB: 1961-04-21,  MRN: [id number]  Chief Complaint  Patient presents with  . Routine Post Op    dos 05.22.2019 Metatarsal Osteotomy 2,3 Rt; Hammertoe Repair w/ Pin possible 2,3 Rt; Poss Plantar Plate Ligament Repair Rt; pt stated, "Pain 8/10 when walking; Toes 2 and 3 hurt; 4th toe is going under my 3rd toe; has swelling; when my foot is on its side, it makes my toes hurt; elevating my foot helps and icing helps; If I stand for walk for awhile, my foot hurts"    DOS: 04/01/18 Procedure: Weil osteotomies right second third metatarsals with pinning of right second metatarsal  57 y.o. female returns for post-op check. Denies N/V/F/Ch. Still having some pain in the 2nd toe.  Objective:   General AA&O x3. Normal mood and affect.  Vascular Foot warm and well perfused.  Neurologic Gross sensation intact.  Dermatologic Skin healing well healed.  Orthopedic: Tenderness to palpation noted about the surgical site.    Assessment & Plan:  Patient was evaluated and treated and all questions answered.  S/p Weil osteotomies right second third metatarsals with pinning of right second metatarsal -Still having some pain. -Continue PT  Return in about 2 weeks (around 07/03/2018) for Post-op.

## 2018-07-20 DIAGNOSIS — Z6828 Body mass index (BMI) 28.0-28.9, adult: Secondary | ICD-10-CM | POA: Diagnosis not present

## 2018-07-20 DIAGNOSIS — G43919 Migraine, unspecified, intractable, without status migrainosus: Secondary | ICD-10-CM | POA: Diagnosis not present

## 2018-07-23 ENCOUNTER — Ambulatory Visit (INDEPENDENT_AMBULATORY_CARE_PROVIDER_SITE_OTHER): Payer: BLUE CROSS/BLUE SHIELD | Admitting: Podiatry

## 2018-07-23 ENCOUNTER — Encounter: Payer: Self-pay | Admitting: Podiatry

## 2018-07-23 VITALS — BP 111/66 | HR 89 | Resp 15

## 2018-07-23 DIAGNOSIS — L905 Scar conditions and fibrosis of skin: Secondary | ICD-10-CM

## 2018-07-23 DIAGNOSIS — L910 Hypertrophic scar: Secondary | ICD-10-CM | POA: Diagnosis not present

## 2018-07-23 DIAGNOSIS — M779 Enthesopathy, unspecified: Secondary | ICD-10-CM | POA: Diagnosis not present

## 2018-07-23 DIAGNOSIS — M7751 Other enthesopathy of right foot: Secondary | ICD-10-CM | POA: Diagnosis not present

## 2018-07-23 DIAGNOSIS — M7752 Other enthesopathy of left foot: Secondary | ICD-10-CM | POA: Diagnosis not present

## 2018-07-23 NOTE — Progress Notes (Signed)
Subjective:  Patient ID: [name], female    DOB: 1961-02-17,  MRN: [id number]  Chief Complaint  Patient presents with  . Routine Post Op    Pt.stated," it started hruting yesterday (6/10) after being barefooted." Tx: icing and heating -pt denies N/V/F/Ch -pt completed PT in Aug    57 y.o. female presents with the above complaint.  States that she has been doing much better recently and not having much pain.  States that she thinks she overdid it yesterday and it started hurting after she was walking around barefoot.  States that she has walked member from before without issue.  Completed PT.  Review of Systems: Negative except as noted in the HPI. Denies N/V/F/Ch.  No past medical history on file.  Current Outpatient Medications:  .  albuterol (PROAIR HFA) 108 (90 Base) MCG/ACT inhaler, ProAir HFA 90 mcg/actuation aerosol inhaler  TAKE 2 PUFFS BY MOUTH EVERY 4 HOURS, Disp: , Rfl:  .  alendronate (FOSAMAX) 70 MG tablet, alendronate 70 mg tablet, Disp: , Rfl:  .  Calcium Carb-Cholecalciferol 500-400 MG-UNIT CHEW, Chew by mouth., Disp: , Rfl:  .  clindamycin (CLEOCIN) 150 MG capsule, Take 1 capsule (150 mg total) by mouth 2 (two) times daily., Disp: 14 capsule, Rfl: 0 .  cyclobenzaprine (FLEXERIL) 10 MG tablet, Take 10 mg by mouth., Disp: , Rfl:  .  diclofenac (VOLTAREN) 75 MG EC tablet, Take 1 tablet (75 mg total) by mouth 2 (two) times daily., Disp: 50 tablet, Rfl: 2 .  fluticasone (FLONASE) 50 MCG/ACT nasal spray, fluticasone propionate 50 mcg/actuation nasal spray,suspension  Spray 1 spray every day by intranasal route as directed for 10 days., Disp: , Rfl:  .  furosemide (LASIX) 40 MG tablet, Take 40 mg by mouth as needed., Disp: , Rfl:  .  gabapentin (NEURONTIN) 300 MG capsule, Take by mouth., Disp: , Rfl:  .  HYDROcodone-acetaminophen (NORCO/VICODIN) 5-325 MG tablet, Take by mouth. For back, Disp: , Rfl:  .  Influenza vac split quadrivalent PF (FLUZONE HIGH-DOSE) 0.5 ML  injection, Inject into the muscle., Disp: , Rfl:  .  meloxicam (MOBIC) 15 MG tablet, Take 1 tablet (15 mg total) by mouth daily., Disp: 30 tablet, Rfl: 0 .  oxyCODONE-acetaminophen (PERCOCET) 10-325 MG tablet, Take 1 tablet by mouth every 4 (four) hours as needed for pain., Disp: 20 tablet, Rfl: 0 .  promethazine (PHENERGAN) 25 MG tablet, Take 1 tablet (25 mg total) by mouth every 8 (eight) hours as needed for nausea or vomiting., Disp: 20 tablet, Rfl: 0 .  rizatriptan (MAXALT) 10 MG tablet, Take 10 mg by mouth., Disp: , Rfl:  .  rosuvastatin (CRESTOR) 5 MG tablet, Take by mouth., Disp: , Rfl:  .  simvastatin (ZOCOR) 20 MG tablet, simvastatin 20 mg tablet, Disp: , Rfl:   Social History   Tobacco Use  Smoking Status Never Smoker  Smokeless Tobacco Never Used    Allergies  Allergen Reactions  . Demerol  [Meperidine Hcl]   . Erythromycin Base   . Meperidine   . Amoxicillin-Pot Clavulanate Nausea And Vomiting  . Latex Rash  . Tape Rash   Objective:   Vitals:   07/23/18 1613  BP: 111/66  Pulse: 89  Resp: 15   There is no height or weight on file to calculate BMI. Constitutional Well developed. Well nourished.  Vascular Dorsalis pedis pulses palpable bilaterally. Posterior tibial pulses palpable bilaterally. Capillary refill normal to all digits.  No cyanosis or clubbing noted. Pedal hair growth  normal.  Neurologic Normal speech. Oriented to person, place, and time. Epicritic sensation to light touch grossly present bilaterally.  Dermatologic Nails well groomed and normal in appearance. No open wounds. Slightly thickened keloid scar with surrounding scar tissue  Orthopedic: Normal joint ROM without pain or crepitus bilaterally. No visible deformities. No bony tenderness about the second MPJ   Radiographs: None today Assessment:   1. Capsulitis   2. Keloid scar   3. Scar tissue    Plan:  Patient was evaluated and treated and all questions answered.  Capsulitis 2nd  MPJ, s/p Weil osteotomy -Overall doing well.  Likely just overuse recently.  Discussed limiting activity when it is especially hurting. -We will fabricate custom molded orthotics for patient. -Would consider possible injection of the keloid scar and scar tissue.  Hold off at this time.  Patient to continue to massage the area.  Return in about 6 weeks (around 09/03/2018) for Capsulitis.

## 2018-07-29 ENCOUNTER — Telehealth: Payer: Self-pay | Admitting: Podiatry

## 2018-07-29 NOTE — Telephone Encounter (Signed)
Thanks. Can you let me know what she decides?

## 2018-07-29 NOTE — Telephone Encounter (Signed)
Pt returned my call and left a message that she got my message and insurance covers orthotics at 80% after deductible and would like to proceed with the orthotics.. I will have Raiford NobleRick process them.

## 2018-07-29 NOTE — Telephone Encounter (Signed)
Left message for pt to call to verify if she is wanting to proceed with the orthotics. They are covered at 80% after deductible and pt has met deductible. Pt would be responsible for about 80.00.

## 2018-09-03 ENCOUNTER — Ambulatory Visit (INDEPENDENT_AMBULATORY_CARE_PROVIDER_SITE_OTHER): Payer: BLUE CROSS/BLUE SHIELD

## 2018-09-03 ENCOUNTER — Encounter: Payer: Self-pay | Admitting: Podiatry

## 2018-09-03 ENCOUNTER — Other Ambulatory Visit: Payer: Self-pay | Admitting: Podiatry

## 2018-09-03 ENCOUNTER — Ambulatory Visit: Payer: BLUE CROSS/BLUE SHIELD | Admitting: Podiatry

## 2018-09-03 ENCOUNTER — Ambulatory Visit: Payer: BLUE CROSS/BLUE SHIELD | Admitting: Orthotics

## 2018-09-03 DIAGNOSIS — M778 Other enthesopathies, not elsewhere classified: Secondary | ICD-10-CM

## 2018-09-03 DIAGNOSIS — Z6828 Body mass index (BMI) 28.0-28.9, adult: Secondary | ICD-10-CM | POA: Diagnosis not present

## 2018-09-03 DIAGNOSIS — M4302 Spondylolysis, cervical region: Secondary | ICD-10-CM | POA: Diagnosis not present

## 2018-09-03 DIAGNOSIS — L989 Disorder of the skin and subcutaneous tissue, unspecified: Secondary | ICD-10-CM | POA: Diagnosis not present

## 2018-09-03 DIAGNOSIS — M779 Enthesopathy, unspecified: Principal | ICD-10-CM

## 2018-09-03 DIAGNOSIS — L905 Scar conditions and fibrosis of skin: Secondary | ICD-10-CM | POA: Diagnosis not present

## 2018-09-03 DIAGNOSIS — L910 Hypertrophic scar: Secondary | ICD-10-CM

## 2018-09-03 DIAGNOSIS — M21961 Unspecified acquired deformity of right lower leg: Secondary | ICD-10-CM

## 2018-09-04 NOTE — Progress Notes (Signed)
Patient came in today to pick up custom made foot orthotics.  The goals were accomplished and the patient reported no dissatisfaction with said orthotics.  Patient was advised of breakin period and how to report any issues. 

## 2018-09-07 ENCOUNTER — Other Ambulatory Visit: Payer: Self-pay | Admitting: Neurosurgery

## 2018-09-07 DIAGNOSIS — M4302 Spondylolysis, cervical region: Secondary | ICD-10-CM

## 2018-09-10 NOTE — Progress Notes (Signed)
Subjective:  Patient ID: [name], female    DOB: [birth year],  MRN: [id number]  Chief Complaint  Patient presents with  . Capsulitis    right foot follow up; pt stated, "still hurts after being on for a while"    57 y.o. female presents with the above complaint. Doing well back at work having a little bit of pain when on it for a while but otherwise doing well.  Review of Systems: Negative except as noted in the HPI. Denies N/V/F/Ch.  History reviewed. No pertinent past medical history.  Current Outpatient Medications:  .  albuterol (PROAIR HFA) 108 (90 Base) MCG/ACT inhaler, ProAir HFA 90 mcg/actuation aerosol inhaler  TAKE 2 PUFFS BY MOUTH EVERY 4 HOURS, Disp: , Rfl:  .  alendronate (FOSAMAX) 70 MG tablet, alendronate 70 mg tablet, Disp: , Rfl:  .  Calcium Carb-Cholecalciferol 500-400 MG-UNIT CHEW, Chew by mouth., Disp: , Rfl:  .  clindamycin (CLEOCIN) 150 MG capsule, Take 1 capsule (150 mg total) by mouth 2 (two) times daily., Disp: 14 capsule, Rfl: 0 .  cyclobenzaprine (FLEXERIL) 10 MG tablet, Take 10 mg by mouth., Disp: , Rfl:  .  diclofenac (VOLTAREN) 75 MG EC tablet, Take 1 tablet (75 mg total) by mouth 2 (two) times daily., Disp: 50 tablet, Rfl: 2 .  fluticasone (FLONASE) 50 MCG/ACT nasal spray, fluticasone propionate 50 mcg/actuation nasal spray,suspension  Spray 1 spray every day by intranasal route as directed for 10 days., Disp: , Rfl:  .  furosemide (LASIX) 40 MG tablet, Take 40 mg by mouth as needed., Disp: , Rfl:  .  gabapentin (NEURONTIN) 300 MG capsule, Take by mouth., Disp: , Rfl:  .  HYDROcodone-acetaminophen (NORCO/VICODIN) 5-325 MG tablet, Take by mouth. For back, Disp: , Rfl:  .  Influenza vac split quadrivalent PF (FLUZONE HIGH-DOSE) 0.5 ML injection, Inject into the muscle., Disp: , Rfl:  .  meloxicam (MOBIC) 15 MG tablet, Take 1 tablet (15 mg total) by mouth daily., Disp: 30 tablet, Rfl: 0 .  oxyCODONE-acetaminophen (PERCOCET) 10-325 MG tablet, Take 1  tablet by mouth every 4 (four) hours as needed for pain., Disp: 20 tablet, Rfl: 0 .  promethazine (PHENERGAN) 25 MG tablet, Take 1 tablet (25 mg total) by mouth every 8 (eight) hours as needed for nausea or vomiting., Disp: 20 tablet, Rfl: 0 .  rizatriptan (MAXALT) 10 MG tablet, Take 10 mg by mouth., Disp: , Rfl:  .  rosuvastatin (CRESTOR) 5 MG tablet, Take by mouth., Disp: , Rfl:  .  simvastatin (ZOCOR) 20 MG tablet, simvastatin 20 mg tablet, Disp: , Rfl:  .  zolpidem (AMBIEN) 5 MG tablet, TAKE 1/2 TO 1 TABLET BY MOUTH AT BEDTIME AS NEEDED FOR INSOMNIA, Disp: , Rfl:   Social History   Tobacco Use  Smoking Status Never Smoker  Smokeless Tobacco Never Used    Allergies  Allergen Reactions  . Codeine Nausea Only  . Demerol  [Meperidine Hcl]   . Erythromycin Base   . Meperidine   . Amoxicillin-Pot Clavulanate Nausea And Vomiting  . Latex Rash  . Tape Rash   Objective:   There were no vitals filed for this visit. There is no height or weight on file to calculate BMI. Constitutional Well developed. Well nourished.  Vascular Dorsalis pedis pulses palpable bilaterally. Posterior tibial pulses palpable bilaterally. Capillary refill normal to all digits.  No cyanosis or clubbing noted. Pedal hair growth normal.  Neurologic Normal speech. Oriented to person, place, and time. Epicritic sensation to  light touch grossly present bilaterally.  Dermatologic Nails well groomed and normal in appearance. No open wounds. Slightly thickened keloid scar with surrounding scar tissue  Orthopedic: Normal joint ROM without pain or crepitus bilaterally. No visible deformities. No bony tenderness about the second MPJ   Radiographs: None today Assessment:   1. Capsulitis   2. Keloid scar   3. Scar tissue   4. Benign skin lesion    Plan:  Patient was evaluated and treated and all questions answered.  Capsulitis 2nd MPJ, s/p Weil osteotomy -Continue orthtoics -Pain improving, mostly  appears to be just scar tissue pain.  Painful Hypertrophic Scar -Intralesional injection as below.  Procedure: Lesion Injection Location: Hypertrophic scar right 2nd MPJ Skin Prep: Alcohol. Injectate: 0.5 cc 1% lidocaine plain, 0.5 cc dexamethasone phosphate. Disposition: Patient tolerated procedure well. Injection site dressed with a band-aid.  Return in about 6 weeks (around 10/15/2018) for Capsulitis right.

## 2018-09-13 ENCOUNTER — Ambulatory Visit
Admission: RE | Admit: 2018-09-13 | Discharge: 2018-09-13 | Disposition: A | Discharge status: Home / Self Care | Payer: BLUE CROSS/BLUE SHIELD | Source: Ambulatory Visit | Attending: Neurosurgery | Admitting: Neurosurgery

## 2018-09-13 DIAGNOSIS — M4302 Spondylolysis, cervical region: Secondary | ICD-10-CM

## 2018-09-13 DIAGNOSIS — M4802 Spinal stenosis, cervical region: Secondary | ICD-10-CM | POA: Diagnosis not present

## 2018-09-16 DIAGNOSIS — M4302 Spondylolysis, cervical region: Secondary | ICD-10-CM | POA: Diagnosis not present

## 2018-09-16 DIAGNOSIS — Z6829 Body mass index (BMI) 29.0-29.9, adult: Secondary | ICD-10-CM | POA: Diagnosis not present

## 2018-09-16 DIAGNOSIS — M5481 Occipital neuralgia: Secondary | ICD-10-CM | POA: Diagnosis not present

## 2018-09-18 ENCOUNTER — Other Ambulatory Visit: Payer: Self-pay | Admitting: Neurosurgery

## 2018-09-18 DIAGNOSIS — M4302 Spondylolysis, cervical region: Secondary | ICD-10-CM

## 2018-09-28 ENCOUNTER — Ambulatory Visit
Admission: RE | Admit: 2018-09-28 | Discharge: 2018-09-28 | Disposition: A | Discharge status: Home / Self Care | Payer: BLUE CROSS/BLUE SHIELD | Source: Ambulatory Visit | Attending: Neurosurgery | Admitting: Neurosurgery

## 2018-09-28 DIAGNOSIS — M50323 Other cervical disc degeneration at C6-C7 level: Secondary | ICD-10-CM | POA: Diagnosis not present

## 2018-09-28 DIAGNOSIS — M4302 Spondylolysis, cervical region: Secondary | ICD-10-CM

## 2018-09-28 MED ORDER — TRIAMCINOLONE ACETONIDE 40 MG/ML IJ SUSP (RADIOLOGY)
60.0000 mg | Freq: Once | INTRAMUSCULAR | Status: AC
  Administered 2018-09-28: 60 mg via EPIDURAL

## 2018-09-28 MED ORDER — IOPAMIDOL (ISOVUE-M 300) INJECTION 61%
1.0000 mL | Freq: Once | INTRAMUSCULAR | Status: AC | PRN
  Administered 2018-09-28: 1 mL via EPIDURAL

## 2018-09-28 NOTE — Discharge Instructions (Signed)

## 2018-10-07 DIAGNOSIS — Z6829 Body mass index (BMI) 29.0-29.9, adult: Secondary | ICD-10-CM | POA: Diagnosis not present

## 2018-10-07 DIAGNOSIS — M4302 Spondylolysis, cervical region: Secondary | ICD-10-CM | POA: Diagnosis not present

## 2018-10-15 ENCOUNTER — Ambulatory Visit: Payer: BLUE CROSS/BLUE SHIELD | Admitting: Podiatry

## 2018-10-16 DIAGNOSIS — G43909 Migraine, unspecified, not intractable, without status migrainosus: Secondary | ICD-10-CM | POA: Diagnosis not present

## 2018-10-16 DIAGNOSIS — Z79899 Other long term (current) drug therapy: Secondary | ICD-10-CM | POA: Diagnosis not present

## 2018-10-16 DIAGNOSIS — M810 Age-related osteoporosis without current pathological fracture: Secondary | ICD-10-CM | POA: Diagnosis not present

## 2018-10-16 DIAGNOSIS — Z91048 Other nonmedicinal substance allergy status: Secondary | ICD-10-CM | POA: Diagnosis not present

## 2018-10-16 DIAGNOSIS — Z91040 Latex allergy status: Secondary | ICD-10-CM | POA: Diagnosis not present

## 2018-10-16 DIAGNOSIS — Z01818 Encounter for other preprocedural examination: Secondary | ICD-10-CM | POA: Diagnosis not present

## 2018-10-16 DIAGNOSIS — Z886 Allergy status to analgesic agent status: Secondary | ICD-10-CM | POA: Diagnosis not present

## 2018-10-16 DIAGNOSIS — Z981 Arthrodesis status: Secondary | ICD-10-CM | POA: Diagnosis not present

## 2018-10-16 DIAGNOSIS — M47812 Spondylosis without myelopathy or radiculopathy, cervical region: Secondary | ICD-10-CM | POA: Diagnosis not present

## 2018-10-16 DIAGNOSIS — K589 Irritable bowel syndrome without diarrhea: Secondary | ICD-10-CM | POA: Diagnosis not present

## 2018-10-16 DIAGNOSIS — Z8249 Family history of ischemic heart disease and other diseases of the circulatory system: Secondary | ICD-10-CM | POA: Diagnosis not present

## 2018-10-16 DIAGNOSIS — Z888 Allergy status to other drugs, medicaments and biological substances status: Secondary | ICD-10-CM | POA: Diagnosis not present

## 2018-10-16 DIAGNOSIS — E7800 Pure hypercholesterolemia, unspecified: Secondary | ICD-10-CM | POA: Diagnosis not present

## 2018-10-26 DIAGNOSIS — K589 Irritable bowel syndrome without diarrhea: Secondary | ICD-10-CM | POA: Diagnosis not present

## 2018-10-26 DIAGNOSIS — Z981 Arthrodesis status: Secondary | ICD-10-CM | POA: Diagnosis not present

## 2018-10-26 DIAGNOSIS — M47812 Spondylosis without myelopathy or radiculopathy, cervical region: Secondary | ICD-10-CM | POA: Diagnosis not present

## 2018-10-26 DIAGNOSIS — E7800 Pure hypercholesterolemia, unspecified: Secondary | ICD-10-CM | POA: Diagnosis not present

## 2018-10-26 DIAGNOSIS — Z79899 Other long term (current) drug therapy: Secondary | ICD-10-CM | POA: Diagnosis not present

## 2018-10-26 DIAGNOSIS — Z91040 Latex allergy status: Secondary | ICD-10-CM | POA: Diagnosis not present

## 2018-10-26 DIAGNOSIS — G43909 Migraine, unspecified, not intractable, without status migrainosus: Secondary | ICD-10-CM | POA: Diagnosis not present

## 2018-10-26 DIAGNOSIS — Z888 Allergy status to other drugs, medicaments and biological substances status: Secondary | ICD-10-CM | POA: Diagnosis not present

## 2018-10-26 DIAGNOSIS — Z91048 Other nonmedicinal substance allergy status: Secondary | ICD-10-CM | POA: Diagnosis not present

## 2018-10-26 DIAGNOSIS — Z8249 Family history of ischemic heart disease and other diseases of the circulatory system: Secondary | ICD-10-CM | POA: Diagnosis not present

## 2018-10-26 DIAGNOSIS — M810 Age-related osteoporosis without current pathological fracture: Secondary | ICD-10-CM | POA: Diagnosis not present

## 2018-10-26 DIAGNOSIS — Z886 Allergy status to analgesic agent status: Secondary | ICD-10-CM | POA: Diagnosis not present

## 2018-10-26 DIAGNOSIS — M4322 Fusion of spine, cervical region: Secondary | ICD-10-CM | POA: Diagnosis not present

## 2018-10-27 DIAGNOSIS — M810 Age-related osteoporosis without current pathological fracture: Secondary | ICD-10-CM | POA: Diagnosis not present

## 2018-10-27 DIAGNOSIS — Z91048 Other nonmedicinal substance allergy status: Secondary | ICD-10-CM | POA: Diagnosis not present

## 2018-10-27 DIAGNOSIS — Z981 Arthrodesis status: Secondary | ICD-10-CM | POA: Diagnosis not present

## 2018-10-27 DIAGNOSIS — Z886 Allergy status to analgesic agent status: Secondary | ICD-10-CM | POA: Diagnosis not present

## 2018-10-27 DIAGNOSIS — Z91040 Latex allergy status: Secondary | ICD-10-CM | POA: Diagnosis not present

## 2018-10-27 DIAGNOSIS — K589 Irritable bowel syndrome without diarrhea: Secondary | ICD-10-CM | POA: Diagnosis not present

## 2018-10-27 DIAGNOSIS — Z79899 Other long term (current) drug therapy: Secondary | ICD-10-CM | POA: Diagnosis not present

## 2018-10-27 DIAGNOSIS — G43909 Migraine, unspecified, not intractable, without status migrainosus: Secondary | ICD-10-CM | POA: Diagnosis not present

## 2018-10-27 DIAGNOSIS — Z888 Allergy status to other drugs, medicaments and biological substances status: Secondary | ICD-10-CM | POA: Diagnosis not present

## 2018-10-27 DIAGNOSIS — Z8249 Family history of ischemic heart disease and other diseases of the circulatory system: Secondary | ICD-10-CM | POA: Diagnosis not present

## 2018-10-27 DIAGNOSIS — M47812 Spondylosis without myelopathy or radiculopathy, cervical region: Secondary | ICD-10-CM | POA: Diagnosis not present

## 2018-10-27 DIAGNOSIS — E7800 Pure hypercholesterolemia, unspecified: Secondary | ICD-10-CM | POA: Diagnosis not present

## 2018-11-05 DIAGNOSIS — M4302 Spondylolysis, cervical region: Secondary | ICD-10-CM | POA: Diagnosis not present

## 2018-11-05 DIAGNOSIS — M4326 Fusion of spine, lumbar region: Secondary | ICD-10-CM | POA: Diagnosis not present

## 2018-11-05 DIAGNOSIS — Z981 Arthrodesis status: Secondary | ICD-10-CM | POA: Diagnosis not present

## 2018-12-14 DIAGNOSIS — Z01419 Encounter for gynecological examination (general) (routine) without abnormal findings: Secondary | ICD-10-CM | POA: Diagnosis not present

## 2018-12-14 DIAGNOSIS — Z13820 Encounter for screening for osteoporosis: Secondary | ICD-10-CM | POA: Diagnosis not present

## 2018-12-14 DIAGNOSIS — Z6828 Body mass index (BMI) 28.0-28.9, adult: Secondary | ICD-10-CM | POA: Diagnosis not present

## 2018-12-15 DIAGNOSIS — R609 Edema, unspecified: Secondary | ICD-10-CM | POA: Diagnosis not present

## 2018-12-15 DIAGNOSIS — E7800 Pure hypercholesterolemia, unspecified: Secondary | ICD-10-CM | POA: Diagnosis not present

## 2018-12-15 DIAGNOSIS — M810 Age-related osteoporosis without current pathological fracture: Secondary | ICD-10-CM | POA: Diagnosis not present

## 2018-12-15 DIAGNOSIS — G43909 Migraine, unspecified, not intractable, without status migrainosus: Secondary | ICD-10-CM | POA: Diagnosis not present

## 2018-12-31 DIAGNOSIS — R590 Localized enlarged lymph nodes: Secondary | ICD-10-CM | POA: Diagnosis not present

## 2018-12-31 DIAGNOSIS — Z6829 Body mass index (BMI) 29.0-29.9, adult: Secondary | ICD-10-CM | POA: Diagnosis not present

## 2019-01-01 ENCOUNTER — Other Ambulatory Visit: Payer: Self-pay | Admitting: Neurosurgery

## 2019-01-01 DIAGNOSIS — R590 Localized enlarged lymph nodes: Secondary | ICD-10-CM

## 2019-01-05 ENCOUNTER — Ambulatory Visit
Admission: RE | Admit: 2019-01-05 | Discharge: 2019-01-05 | Disposition: A | Discharge status: Home / Self Care | Payer: BLUE CROSS/BLUE SHIELD | Source: Ambulatory Visit | Attending: Neurosurgery | Admitting: Neurosurgery

## 2019-01-05 DIAGNOSIS — R590 Localized enlarged lymph nodes: Secondary | ICD-10-CM

## 2019-01-05 DIAGNOSIS — M50223 Other cervical disc displacement at C6-C7 level: Secondary | ICD-10-CM | POA: Diagnosis not present

## 2019-01-05 DIAGNOSIS — M4802 Spinal stenosis, cervical region: Secondary | ICD-10-CM | POA: Diagnosis not present

## 2019-01-05 MED ORDER — GADOBENATE DIMEGLUMINE 529 MG/ML IV SOLN
15.0000 mL | Freq: Once | INTRAVENOUS | Status: AC | PRN
  Administered 2019-01-05: 15 mL via INTRAVENOUS

## 2019-01-20 DIAGNOSIS — S61214A Laceration without foreign body of right ring finger without damage to nail, initial encounter: Secondary | ICD-10-CM | POA: Diagnosis not present

## 2019-01-22 DIAGNOSIS — S61219A Laceration without foreign body of unspecified finger without damage to nail, initial encounter: Secondary | ICD-10-CM | POA: Diagnosis not present

## 2019-01-22 DIAGNOSIS — R590 Localized enlarged lymph nodes: Secondary | ICD-10-CM | POA: Diagnosis not present

## 2019-02-16 ENCOUNTER — Other Ambulatory Visit: Payer: Self-pay | Admitting: Physician Assistant

## 2019-02-16 DIAGNOSIS — Z1231 Encounter for screening mammogram for malignant neoplasm of breast: Secondary | ICD-10-CM

## 2019-02-23 DIAGNOSIS — Z6829 Body mass index (BMI) 29.0-29.9, adult: Secondary | ICD-10-CM | POA: Diagnosis not present

## 2019-02-23 DIAGNOSIS — M4302 Spondylolysis, cervical region: Secondary | ICD-10-CM | POA: Diagnosis not present

## 2019-04-19 ENCOUNTER — Ambulatory Visit: Payer: BLUE CROSS/BLUE SHIELD

## 2019-04-23 ENCOUNTER — Ambulatory Visit: Payer: BLUE CROSS/BLUE SHIELD

## 2019-04-23 DIAGNOSIS — R6889 Other general symptoms and signs: Secondary | ICD-10-CM | POA: Diagnosis not present

## 2019-06-07 ENCOUNTER — Ambulatory Visit
Admission: RE | Admit: 2019-06-07 | Discharge: 2019-06-07 | Disposition: A | Discharge status: Home / Self Care | Payer: BC Managed Care – PPO | Source: Ambulatory Visit | Attending: Physician Assistant | Admitting: Physician Assistant

## 2019-06-07 ENCOUNTER — Other Ambulatory Visit: Payer: Self-pay

## 2019-06-07 DIAGNOSIS — Z1231 Encounter for screening mammogram for malignant neoplasm of breast: Secondary | ICD-10-CM

## 2019-06-17 DIAGNOSIS — G43909 Migraine, unspecified, not intractable, without status migrainosus: Secondary | ICD-10-CM | POA: Diagnosis not present

## 2019-06-17 DIAGNOSIS — R609 Edema, unspecified: Secondary | ICD-10-CM | POA: Diagnosis not present

## 2019-06-17 DIAGNOSIS — E7800 Pure hypercholesterolemia, unspecified: Secondary | ICD-10-CM | POA: Diagnosis not present

## 2019-06-17 DIAGNOSIS — G4700 Insomnia, unspecified: Secondary | ICD-10-CM | POA: Diagnosis not present

## 2019-06-24 DIAGNOSIS — M25552 Pain in left hip: Secondary | ICD-10-CM | POA: Diagnosis not present

## 2019-06-28 DIAGNOSIS — M545 Low back pain: Secondary | ICD-10-CM | POA: Diagnosis not present

## 2019-06-28 DIAGNOSIS — M25552 Pain in left hip: Secondary | ICD-10-CM | POA: Diagnosis not present

## 2019-08-22 DIAGNOSIS — N390 Urinary tract infection, site not specified: Secondary | ICD-10-CM | POA: Diagnosis not present

## 2019-08-22 DIAGNOSIS — R319 Hematuria, unspecified: Secondary | ICD-10-CM | POA: Diagnosis not present

## 2019-08-26 DIAGNOSIS — R109 Unspecified abdominal pain: Secondary | ICD-10-CM | POA: Diagnosis not present

## 2019-08-26 DIAGNOSIS — R3129 Other microscopic hematuria: Secondary | ICD-10-CM | POA: Diagnosis not present

## 2019-08-26 DIAGNOSIS — R1031 Right lower quadrant pain: Secondary | ICD-10-CM | POA: Diagnosis not present

## 2019-08-26 DIAGNOSIS — R102 Pelvic and perineal pain: Secondary | ICD-10-CM | POA: Diagnosis not present

## 2019-08-26 DIAGNOSIS — N952 Postmenopausal atrophic vaginitis: Secondary | ICD-10-CM | POA: Diagnosis not present

## 2019-08-26 DIAGNOSIS — Z79899 Other long term (current) drug therapy: Secondary | ICD-10-CM | POA: Diagnosis not present

## 2019-08-26 DIAGNOSIS — R350 Frequency of micturition: Secondary | ICD-10-CM | POA: Diagnosis not present

## 2019-09-03 DIAGNOSIS — R3129 Other microscopic hematuria: Secondary | ICD-10-CM | POA: Diagnosis not present

## 2019-09-03 DIAGNOSIS — N200 Calculus of kidney: Secondary | ICD-10-CM | POA: Diagnosis not present

## 2019-09-09 DIAGNOSIS — S29019A Strain of muscle and tendon of unspecified wall of thorax, initial encounter: Secondary | ICD-10-CM | POA: Diagnosis not present

## 2019-09-17 DIAGNOSIS — N200 Calculus of kidney: Secondary | ICD-10-CM | POA: Diagnosis not present

## 2019-09-17 DIAGNOSIS — R1031 Right lower quadrant pain: Secondary | ICD-10-CM | POA: Diagnosis not present

## 2019-09-24 DIAGNOSIS — Z87442 Personal history of urinary calculi: Secondary | ICD-10-CM | POA: Diagnosis not present

## 2019-09-24 DIAGNOSIS — N200 Calculus of kidney: Secondary | ICD-10-CM | POA: Diagnosis not present

## 2019-10-04 DIAGNOSIS — R1031 Right lower quadrant pain: Secondary | ICD-10-CM | POA: Diagnosis not present

## 2019-10-04 DIAGNOSIS — N200 Calculus of kidney: Secondary | ICD-10-CM | POA: Diagnosis not present

## 2019-11-02 DIAGNOSIS — N200 Calculus of kidney: Secondary | ICD-10-CM | POA: Diagnosis not present

## 2019-11-18 DIAGNOSIS — Z6826 Body mass index (BMI) 26.0-26.9, adult: Secondary | ICD-10-CM | POA: Diagnosis not present

## 2019-11-18 DIAGNOSIS — R1011 Right upper quadrant pain: Secondary | ICD-10-CM | POA: Diagnosis not present

## 2019-11-19 DIAGNOSIS — K8020 Calculus of gallbladder without cholecystitis without obstruction: Secondary | ICD-10-CM | POA: Diagnosis not present

## 2019-11-19 DIAGNOSIS — R1011 Right upper quadrant pain: Secondary | ICD-10-CM | POA: Diagnosis not present

## 2019-11-20 DIAGNOSIS — K819 Cholecystitis, unspecified: Secondary | ICD-10-CM | POA: Diagnosis not present

## 2019-11-20 DIAGNOSIS — Z79899 Other long term (current) drug therapy: Secondary | ICD-10-CM | POA: Diagnosis not present

## 2019-11-20 DIAGNOSIS — R1011 Right upper quadrant pain: Secondary | ICD-10-CM | POA: Diagnosis not present

## 2019-11-22 DIAGNOSIS — K8010 Calculus of gallbladder with chronic cholecystitis without obstruction: Secondary | ICD-10-CM | POA: Insufficient documentation

## 2019-11-23 DIAGNOSIS — K8010 Calculus of gallbladder with chronic cholecystitis without obstruction: Secondary | ICD-10-CM | POA: Diagnosis not present

## 2019-11-23 DIAGNOSIS — K915 Postcholecystectomy syndrome: Secondary | ICD-10-CM | POA: Diagnosis not present

## 2019-11-23 DIAGNOSIS — E785 Hyperlipidemia, unspecified: Secondary | ICD-10-CM | POA: Diagnosis not present

## 2019-11-23 DIAGNOSIS — K811 Chronic cholecystitis: Secondary | ICD-10-CM | POA: Diagnosis not present

## 2019-11-23 DIAGNOSIS — Z79891 Long term (current) use of opiate analgesic: Secondary | ICD-10-CM | POA: Diagnosis not present

## 2019-11-23 DIAGNOSIS — Z79899 Other long term (current) drug therapy: Secondary | ICD-10-CM | POA: Diagnosis not present

## 2019-11-23 DIAGNOSIS — M1990 Unspecified osteoarthritis, unspecified site: Secondary | ICD-10-CM | POA: Diagnosis not present

## 2019-12-02 DIAGNOSIS — Z09 Encounter for follow-up examination after completed treatment for conditions other than malignant neoplasm: Secondary | ICD-10-CM | POA: Insufficient documentation

## 2019-12-08 DIAGNOSIS — G4700 Insomnia, unspecified: Secondary | ICD-10-CM | POA: Diagnosis not present

## 2019-12-08 DIAGNOSIS — G43909 Migraine, unspecified, not intractable, without status migrainosus: Secondary | ICD-10-CM | POA: Diagnosis not present

## 2019-12-08 DIAGNOSIS — R609 Edema, unspecified: Secondary | ICD-10-CM | POA: Diagnosis not present

## 2019-12-08 DIAGNOSIS — E7800 Pure hypercholesterolemia, unspecified: Secondary | ICD-10-CM | POA: Diagnosis not present

## 2020-01-16 ENCOUNTER — Ambulatory Visit: Payer: BC Managed Care – PPO | Attending: Internal Medicine

## 2020-01-16 DIAGNOSIS — Z23 Encounter for immunization: Secondary | ICD-10-CM

## 2020-01-16 NOTE — Progress Notes (Signed)
   Covid-19 Vaccination Clinic  Name:  [name]    MRN: [id number] DOB: 1961/08/26  01/16/2020  [name] was observed post Covid-19 immunization for 15 minutes without incident. She was provided with Vaccine Information Sheet and instruction to access the V-Safe system.   [name] was instructed to call 911 with any severe reactions post vaccine: Marland Kitchen Difficulty breathing  . Swelling of face and throat  . A fast heartbeat  . A bad rash all over body  . Dizziness and weakness   Immunizations Administered    Name Date Dose VIS Date Route   Pfizer COVID-19 Vaccine 01/16/2020  9:59 AM 0.3 mL 10/22/2019 Intramuscular   Manufacturer: [employer], [employer]   Lot: [id number]   NDC: [address]-0

## 2020-02-08 ENCOUNTER — Ambulatory Visit: Payer: BC Managed Care – PPO | Attending: Internal Medicine

## 2020-02-08 DIAGNOSIS — Z23 Encounter for immunization: Secondary | ICD-10-CM

## 2020-02-08 NOTE — Progress Notes (Signed)
   Covid-19 Vaccination Clinic  Name:  RENEKA NEBERGALL    MRN: [id number] DOB: 26-Oct-1961  02/08/2020  Ms. Inga was observed post Covid-19 immunization for 15 minutes without incident. She was provided with Vaccine Information Sheet and instruction to access the V-Safe system.   Ms. Cargo was instructed to call 911 with any severe reactions post vaccine: Marland Kitchen Difficulty breathing  . Swelling of face and throat  . A fast heartbeat  . A bad rash all over body  . Dizziness and weakness   Immunizations Administered    Name Date Dose VIS Date Route   Pfizer COVID-19 Vaccine 02/08/2020  4:41 PM 0.3 mL 10/22/2019 Intramuscular   Manufacturer: [employer], [employer]   Lot: [id number]   NDC: [address]-0

## 2020-03-20 DIAGNOSIS — N200 Calculus of kidney: Secondary | ICD-10-CM | POA: Diagnosis not present

## 2020-03-20 DIAGNOSIS — R1031 Right lower quadrant pain: Secondary | ICD-10-CM | POA: Diagnosis not present

## 2020-03-27 ENCOUNTER — Other Ambulatory Visit: Payer: Self-pay | Admitting: Physician Assistant

## 2020-03-27 DIAGNOSIS — Z1231 Encounter for screening mammogram for malignant neoplasm of breast: Secondary | ICD-10-CM

## 2020-03-28 ENCOUNTER — Other Ambulatory Visit: Payer: Self-pay | Admitting: Physician Assistant

## 2020-03-28 DIAGNOSIS — Z1231 Encounter for screening mammogram for malignant neoplasm of breast: Secondary | ICD-10-CM

## 2020-04-03 DIAGNOSIS — N200 Calculus of kidney: Secondary | ICD-10-CM | POA: Insufficient documentation

## 2020-04-03 DIAGNOSIS — M810 Age-related osteoporosis without current pathological fracture: Secondary | ICD-10-CM | POA: Diagnosis not present

## 2020-04-03 DIAGNOSIS — Z6826 Body mass index (BMI) 26.0-26.9, adult: Secondary | ICD-10-CM | POA: Diagnosis not present

## 2020-04-03 DIAGNOSIS — Z01419 Encounter for gynecological examination (general) (routine) without abnormal findings: Secondary | ICD-10-CM | POA: Diagnosis not present

## 2020-04-03 DIAGNOSIS — N952 Postmenopausal atrophic vaginitis: Secondary | ICD-10-CM | POA: Diagnosis not present

## 2020-04-03 DIAGNOSIS — G43909 Migraine, unspecified, not intractable, without status migrainosus: Secondary | ICD-10-CM | POA: Insufficient documentation

## 2020-04-03 DIAGNOSIS — I447 Left bundle-branch block, unspecified: Secondary | ICD-10-CM | POA: Insufficient documentation

## 2020-06-05 DIAGNOSIS — R609 Edema, unspecified: Secondary | ICD-10-CM | POA: Diagnosis not present

## 2020-06-05 DIAGNOSIS — E7800 Pure hypercholesterolemia, unspecified: Secondary | ICD-10-CM | POA: Diagnosis not present

## 2020-06-05 DIAGNOSIS — G4700 Insomnia, unspecified: Secondary | ICD-10-CM | POA: Diagnosis not present

## 2020-06-05 DIAGNOSIS — Z79899 Other long term (current) drug therapy: Secondary | ICD-10-CM | POA: Diagnosis not present

## 2020-06-05 DIAGNOSIS — G43909 Migraine, unspecified, not intractable, without status migrainosus: Secondary | ICD-10-CM | POA: Diagnosis not present

## 2020-06-07 ENCOUNTER — Ambulatory Visit: Payer: BC Managed Care – PPO

## 2020-06-26 ENCOUNTER — Other Ambulatory Visit: Payer: Self-pay

## 2020-06-26 ENCOUNTER — Ambulatory Visit
Admission: RE | Admit: 2020-06-26 | Discharge: 2020-06-26 | Disposition: A | Discharge status: Home / Self Care | Payer: BC Managed Care – PPO | Source: Ambulatory Visit

## 2020-06-26 DIAGNOSIS — Z1231 Encounter for screening mammogram for malignant neoplasm of breast: Secondary | ICD-10-CM | POA: Diagnosis not present

## 2020-08-03 DIAGNOSIS — S9032XA Contusion of left foot, initial encounter: Secondary | ICD-10-CM | POA: Diagnosis not present

## 2020-08-25 ENCOUNTER — Ambulatory Visit (INDEPENDENT_AMBULATORY_CARE_PROVIDER_SITE_OTHER): Payer: BC Managed Care – PPO

## 2020-08-25 ENCOUNTER — Other Ambulatory Visit: Payer: Self-pay

## 2020-08-25 ENCOUNTER — Encounter: Payer: Self-pay | Admitting: Sports Medicine

## 2020-08-25 ENCOUNTER — Ambulatory Visit: Payer: BC Managed Care – PPO | Admitting: Sports Medicine

## 2020-08-25 DIAGNOSIS — S99922A Unspecified injury of left foot, initial encounter: Secondary | ICD-10-CM

## 2020-08-25 DIAGNOSIS — M79675 Pain in left toe(s): Secondary | ICD-10-CM

## 2020-08-25 DIAGNOSIS — S92502A Displaced unspecified fracture of left lesser toe(s), initial encounter for closed fracture: Secondary | ICD-10-CM | POA: Diagnosis not present

## 2020-08-25 NOTE — Progress Notes (Signed)
Subjective: [name] is a 59 y.o. female patient who presents to office for evaluation of left foot pain reports that she hit her baby toe on a chair on 9/22 and went to the urgent care on 08/03/2020 states that she had x-rays there but it was difficult for them to tell there was a fracture and reports that her x-ray was sent to be read by the radiologist and they never called her back about the results reports that initially the fifth toe was really bruised and much more swollen now the swelling is going down but it still hurts.  Patient denies any redness warmth drainage or open lesion.  Patient denies reinjury.  Patient Active Problem List   Diagnosis Date Noted  . Kidney stone 04/03/2020  . Left bundle branch block 04/03/2020  . Migraine 04/03/2020  . Osteoporosis 04/03/2020  . Postoperative examination 12/02/2019  . Calculus of gallbladder with chronic cholecystitis without obstruction 11/22/2019  . Occipital neuralgia 07/09/2017  . Lumbar spondylosis 07/09/2017  . Osteoporosis, post-menopausal 07/09/2017  . Hyperlipidemia 11/03/2016    Current Outpatient Medications on File Prior to Visit  Medication Sig Dispense Refill  . albuterol (PROAIR HFA) 108 (90 Base) MCG/ACT inhaler ProAir HFA 90 mcg/actuation aerosol inhaler  TAKE 2 PUFFS BY MOUTH EVERY 4 HOURS    . alendronate (FOSAMAX) 70 MG tablet alendronate 70 mg tablet    . azelastine (ASTELIN) 0.1 % nasal spray azelastine 137 mcg (0.1 %) nasal spray aerosol    . Calcium Carb-Cholecalciferol 500-400 MG-UNIT CHEW Chew by mouth.    . cephALEXin (KEFLEX) 500 MG capsule Take by mouth.    . clindamycin (CLEOCIN) 150 MG capsule Take 1 capsule (150 mg total) by mouth 2 (two) times daily. 14 capsule 0  . cyclobenzaprine (FLEXERIL) 10 MG tablet Take 10 mg by mouth.    . cyclobenzaprine (FLEXERIL) 5 MG tablet Take 5 mg by mouth 2 (two) times daily.    . diclofenac (VOLTAREN) 75 MG EC tablet Take 1 tablet (75 mg total) by mouth 2 (two)  times daily. 50 tablet 2  . fluticasone (FLONASE) 50 MCG/ACT nasal spray fluticasone propionate 50 mcg/actuation nasal spray,suspension  Spray 1 spray every day by intranasal route as directed for 10 days.    . furosemide (LASIX) 40 MG tablet Take 40 mg by mouth as needed.    . gabapentin (NEURONTIN) 300 MG capsule Take by mouth.    Marland Kitchen HYDROcodone-acetaminophen (NORCO/VICODIN) 5-325 MG tablet Take by mouth. For back    . Influenza vac split quadrivalent PF (FLUZONE HIGH-DOSE) 0.5 ML injection Inject into the muscle.    . meloxicam (MOBIC) 15 MG tablet Take 1 tablet (15 mg total) by mouth daily. 30 tablet 0  . oxycodone (OXY-IR) 5 MG capsule Take by mouth.    . oxyCODONE-acetaminophen (PERCOCET) 10-325 MG tablet Take 1 tablet by mouth every 4 (four) hours as needed for pain. 20 tablet 0  . promethazine (PHENERGAN) 25 MG tablet Take 1 tablet (25 mg total) by mouth every 8 (eight) hours as needed for nausea or vomiting. 20 tablet 0  . rizatriptan (MAXALT) 10 MG tablet Take 10 mg by mouth.    . rosuvastatin (CRESTOR) 5 MG tablet Take by mouth.    . simvastatin (ZOCOR) 20 MG tablet simvastatin 20 mg tablet    . traMADol (ULTRAM) 50 MG tablet Take 50 mg by mouth every 6 (six) hours as needed.    . zolpidem (AMBIEN) 5 MG tablet TAKE 1/2 TO 1  TABLET BY MOUTH AT BEDTIME AS NEEDED FOR INSOMNIA     No current facility-administered medications on file prior to visit.    Allergies  Allergen Reactions  . Codeine Nausea Only  . Demerol  [Meperidine Hcl]   . Erythromycin Base   . Meperidine   . Amoxicillin-Pot Clavulanate Nausea And Vomiting  . Latex Rash  . Tape Rash    Objective:  General: Alert and oriented x3 in no acute distress  Dermatology: No open lesions bilateral lower extremities, no webspace macerations, no ecchymosis bilateral, all nails x 10 are well manicured.  Vascular: Focal edema noted to left fifth toe.  Dorsalis Pedis and Posterior Tibial pedal pulses 1/4, Capillary Fill Time 5  seconds,(+) pedal hair growth bilateral, Temperature gradient within normal limits.  Neurology: Michaell Cowing sensation intact via light touch bilateral.  Musculoskeletal: There is tenderness with palpation at fifth toe on the left, There is mild pain to palpation to the fifth toe especially at the middle phalanx on the left.  There is mild guarding due to pain.   Gait: Unassisted, mildly antalgic gait  Xrays  Left foot   Impression: Incomplete fracture of the middle phalanx of the left fifth toe with mild soft tissue swelling    Assessment and Plan: Problem List Items Addressed This Visit    None    Visit Diagnoses    Injury of toe on left foot, initial encounter    -  Primary   Relevant Orders   DG Foot Complete Left (Completed)   Closed fracture of phalanx of left fifth toe, initial encounter       Toe pain, left          -Complete examination performed -Xrays reviewed -Discussed treatement options for fracture; risks, alternatives, and benefits explained. -Applied toe splint and educated patient on proper reapplication -Advised patient if pain in toe increases to return to using surgical shoe which she already has at home from previous foot surgery -Recommend protection, rest, ice, elevation daily until symptoms improve -Continue with over-the-counter inflammatories as needed -Patient to return to office in 5 weeks for serial x-rays to assess healing  or sooner if condition worsens.  Asencion Islam, DPM

## 2020-09-22 ENCOUNTER — Ambulatory Visit (INDEPENDENT_AMBULATORY_CARE_PROVIDER_SITE_OTHER): Payer: BC Managed Care – PPO

## 2020-09-22 ENCOUNTER — Ambulatory Visit: Payer: BC Managed Care – PPO | Admitting: Sports Medicine

## 2020-09-22 ENCOUNTER — Other Ambulatory Visit: Payer: Self-pay

## 2020-09-22 ENCOUNTER — Encounter: Payer: Self-pay | Admitting: Sports Medicine

## 2020-09-22 DIAGNOSIS — S92502D Displaced unspecified fracture of left lesser toe(s), subsequent encounter for fracture with routine healing: Secondary | ICD-10-CM | POA: Diagnosis not present

## 2020-09-22 DIAGNOSIS — S99922A Unspecified injury of left foot, initial encounter: Secondary | ICD-10-CM

## 2020-09-22 DIAGNOSIS — M79675 Pain in left toe(s): Secondary | ICD-10-CM

## 2020-09-22 DIAGNOSIS — S99922D Unspecified injury of left foot, subsequent encounter: Secondary | ICD-10-CM

## 2020-09-22 NOTE — Progress Notes (Signed)
Subjective: [name] is a 59 y.o. female who returns to office for follow-up evaluation of left toe fracture.  Patient reports that her toe is feeling good and had a hard time having the tape stayed on the toe.  Pedal complaints noted.  Patient Active Problem List   Diagnosis Date Noted  . Kidney stone 04/03/2020  . Left bundle branch block 04/03/2020  . Migraine 04/03/2020  . Osteoporosis 04/03/2020  . Postoperative examination 12/02/2019  . Calculus of gallbladder with chronic cholecystitis without obstruction 11/22/2019  . Occipital neuralgia 07/09/2017  . Lumbar spondylosis 07/09/2017  . Osteoporosis, post-menopausal 07/09/2017  . Hyperlipidemia 11/03/2016    Current Outpatient Medications on File Prior to Visit  Medication Sig Dispense Refill  . albuterol (PROAIR HFA) 108 (90 Base) MCG/ACT inhaler ProAir HFA 90 mcg/actuation aerosol inhaler  TAKE 2 PUFFS BY MOUTH EVERY 4 HOURS    . alendronate (FOSAMAX) 70 MG tablet alendronate 70 mg tablet    . azelastine (ASTELIN) 0.1 % nasal spray azelastine 137 mcg (0.1 %) nasal spray aerosol    . Calcium Carb-Cholecalciferol 500-400 MG-UNIT CHEW Chew by mouth.    . cephALEXin (KEFLEX) 500 MG capsule Take by mouth.    . clindamycin (CLEOCIN) 150 MG capsule Take 1 capsule (150 mg total) by mouth 2 (two) times daily. 14 capsule 0  . cyclobenzaprine (FLEXERIL) 10 MG tablet Take 10 mg by mouth.    . cyclobenzaprine (FLEXERIL) 5 MG tablet Take 5 mg by mouth 2 (two) times daily.    . diclofenac (VOLTAREN) 75 MG EC tablet Take 1 tablet (75 mg total) by mouth 2 (two) times daily. 50 tablet 2  . fluticasone (FLONASE) 50 MCG/ACT nasal spray fluticasone propionate 50 mcg/actuation nasal spray,suspension  Spray 1 spray every day by intranasal route as directed for 10 days.    . furosemide (LASIX) 40 MG tablet Take 40 mg by mouth as needed.    . gabapentin (NEURONTIN) 300 MG capsule Take by mouth.    Marland Kitchen HYDROcodone-acetaminophen (NORCO/VICODIN)  5-325 MG tablet Take by mouth. For back    . Influenza vac split quadrivalent PF (FLUZONE HIGH-DOSE) 0.5 ML injection Inject into the muscle.    . meloxicam (MOBIC) 15 MG tablet Take 1 tablet (15 mg total) by mouth daily. 30 tablet 0  . oxycodone (OXY-IR) 5 MG capsule Take by mouth.    . oxyCODONE-acetaminophen (PERCOCET) 10-325 MG tablet Take 1 tablet by mouth every 4 (four) hours as needed for pain. 20 tablet 0  . promethazine (PHENERGAN) 25 MG tablet Take 1 tablet (25 mg total) by mouth every 8 (eight) hours as needed for nausea or vomiting. 20 tablet 0  . rizatriptan (MAXALT) 10 MG tablet Take 10 mg by mouth.    . rosuvastatin (CRESTOR) 5 MG tablet Take by mouth.    . simvastatin (ZOCOR) 20 MG tablet simvastatin 20 mg tablet    . traMADol (ULTRAM) 50 MG tablet Take 50 mg by mouth every 6 (six) hours as needed.    . zolpidem (AMBIEN) 5 MG tablet TAKE 1/2 TO 1 TABLET BY MOUTH AT BEDTIME AS NEEDED FOR INSOMNIA     No current facility-administered medications on file prior to visit.    Allergies  Allergen Reactions  . Codeine Nausea Only  . Demerol  [Meperidine Hcl]   . Erythromycin Base   . Meperidine   . Amoxicillin-Pot Clavulanate Nausea And Vomiting  . Latex Rash  . Tape Rash    Objective:  General: Alert  and oriented x3 in no acute distress  Dermatology: No open lesions bilateral lower extremities, no webspace macerations, no ecchymosis bilateral, all nails x 10 are well manicured.  Vascular: Focal edema noted to left fifth toe.  Dorsalis Pedis and Posterior Tibial pedal pulses 1/4, Capillary Fill Time 5 seconds,(+) pedal hair growth bilateral, Temperature gradient within normal limits.  Neurology: Michaell Cowing sensation intact via light touch bilateral.  Musculoskeletal: There is no reproducible tenderness with palpation at fifth toe on the left, range of motion within normal limits.  Xrays  Left foot   Impression: Incomplete fracture of the middle phalanx of the left fifth toe  with mild soft tissue swelling appears to be 85% healed    Assessment and Plan: Problem List Items Addressed This Visit    None    Visit Diagnoses    Injury of toe on left foot, subsequent encounter    -  Primary   Relevant Orders   DG Foot Complete Left   Closed fracture of phalanx of left fifth toe with routine healing, subsequent encounter       Toe pain, left          -Complete examination performed -Xrays reviewed -Re-Discussed treatement options for fracture; risks, alternatives, and benefits explained. -Since patient is clinically doing very well may discontinue buddy taping -May return to normal shoe and activities -Recommend continue with protection, rest, ice, elevation daily until symptoms improve -Continue with over-the-counter inflammatories as needed -Patient to return to office if fails to continue to improve.  Asencion Islam, DPM

## 2020-10-24 IMAGING — MR MR CERVICAL SPINE W/O CM
4 of 5 series · 30 of 48 positions shown · non-contrast
Comparison: 09/14/2017

CLINICAL DATA: Chronic neck pain. Pain and weakness in the left
arm. Prior cervical fusion.

EXAM:
MRI CERVICAL SPINE WITHOUT CONTRAST
TECHNIQUE: Multiplanar, multisequence MR imaging of the cervical spine was
performed. No intravenous contrast was administered.

[Series 2: T2 · sagittal · 3.0mm · 0.41mm/px · 7 of 13 slices shown (1 of 2)]
[im 1/13]
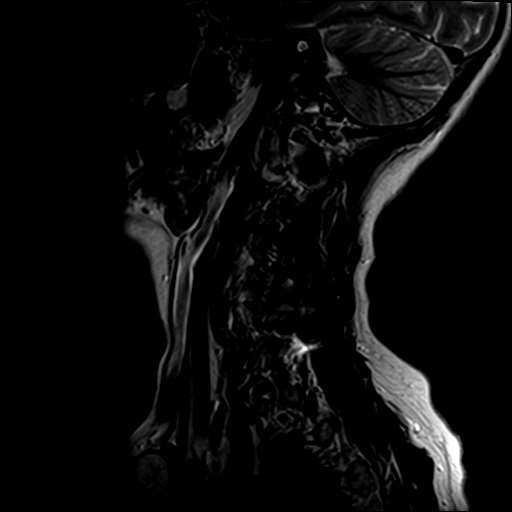
[im 3/13]
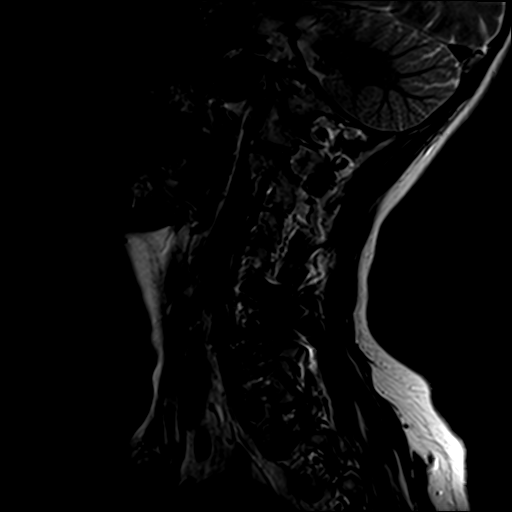
[im 5/13]
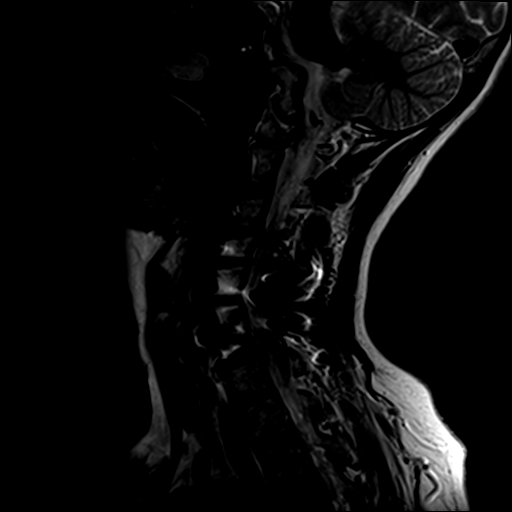
[im 7/13]
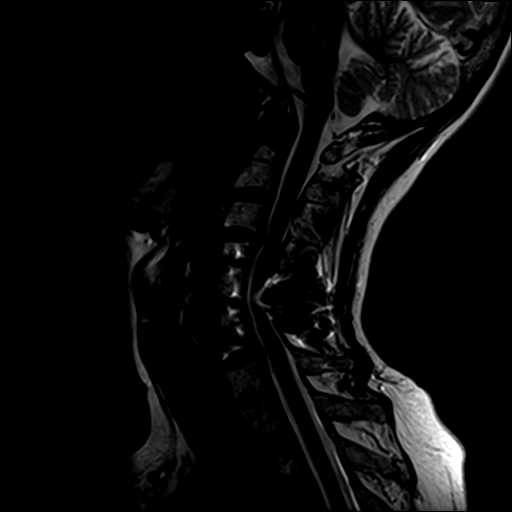
[im 9/13]
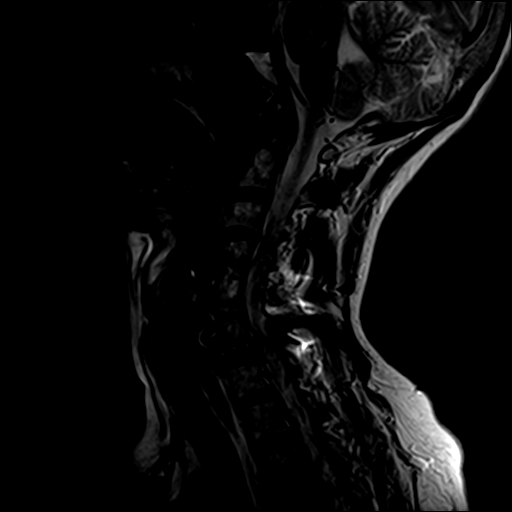
[im 11/13]
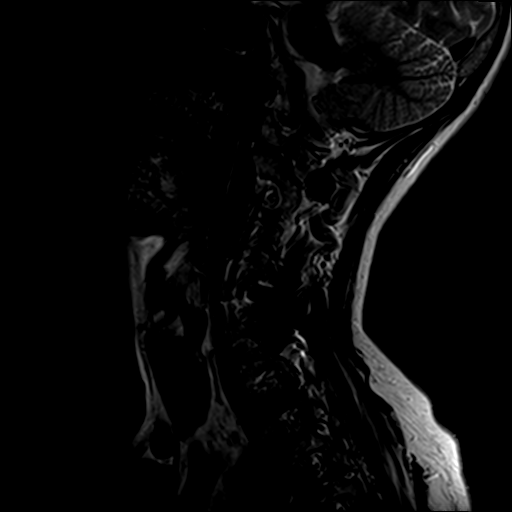
[im 13/13]
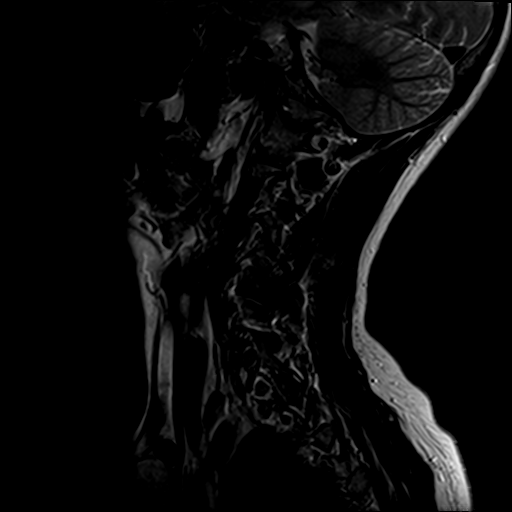

[Series 3: T1 · sagittal · 3.0mm · 0.41mm/px · 7 of 13 slices shown]
[im 1/13]
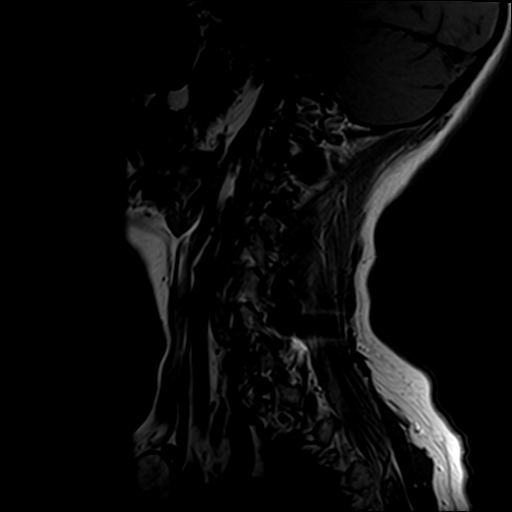
[im 3/13]
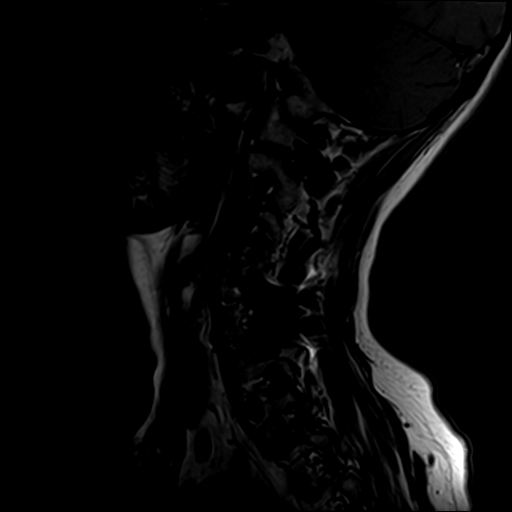
[im 5/13]
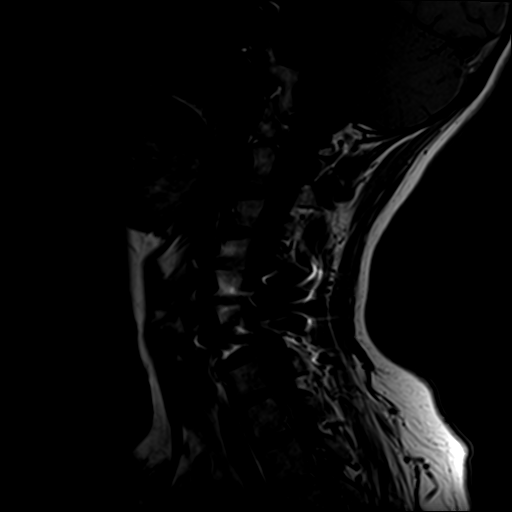
[im 7/13]
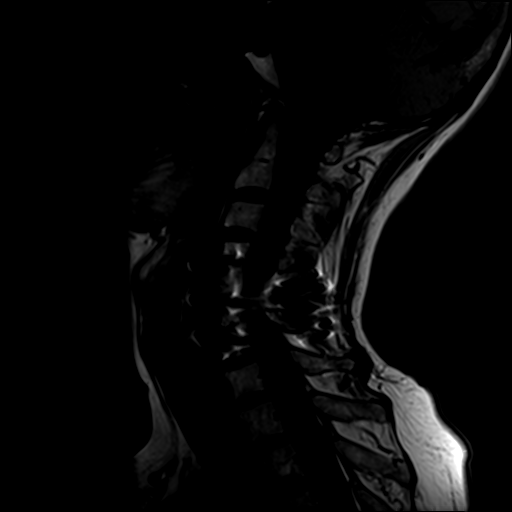
[im 9/13]
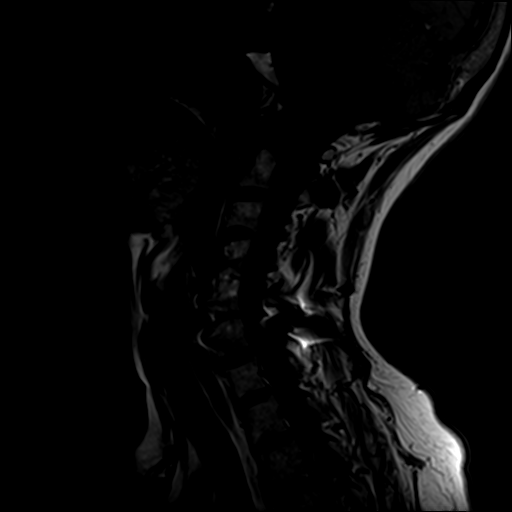
[im 11/13]
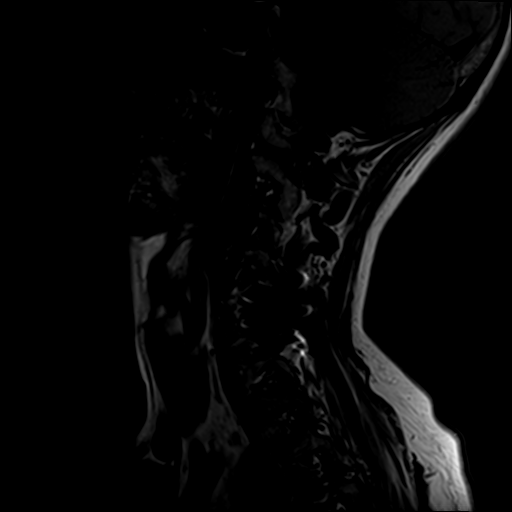
[im 13/13]
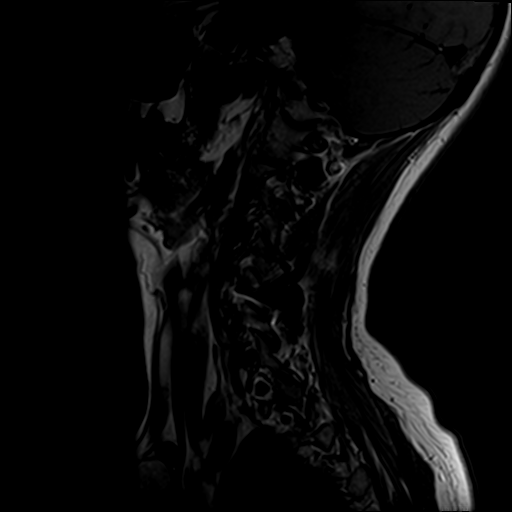

[Series 4: STIR · sagittal · 3.0mm · 0.82mm/px · 7 of 13 slices shown]
[im 1/13]
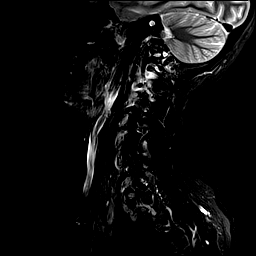
[im 3/13]
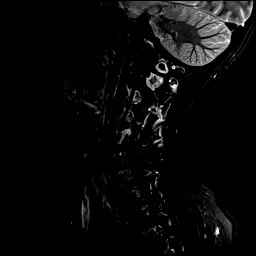
[im 5/13]
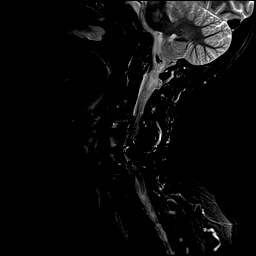
[im 7/13]
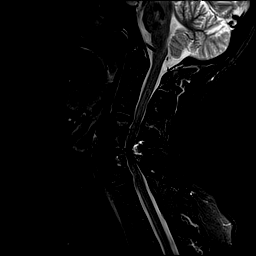
[im 9/13]
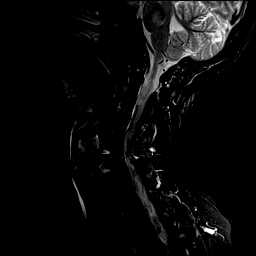
[im 11/13]
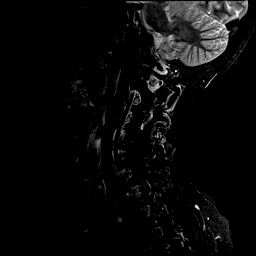
[im 13/13]
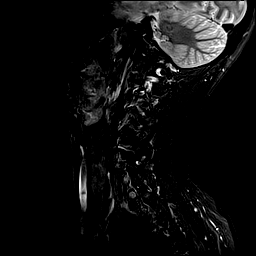

[Series 6: T2 · axial · 3.0mm · 0.70mm/px · z∈[-80,+12]mm · 9 of 23 slices shown (2 of 2)]
[im 1/23]
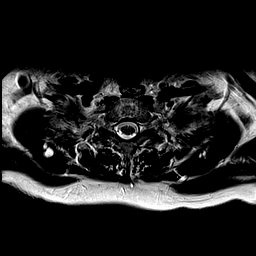
[im 4/23]
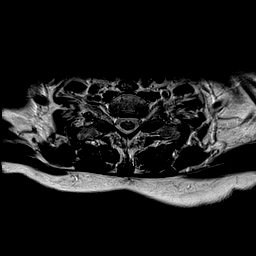
[im 8/23]
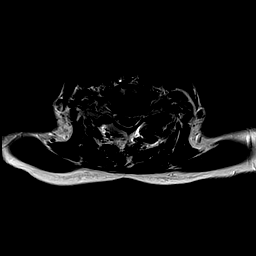
[im 10/23]
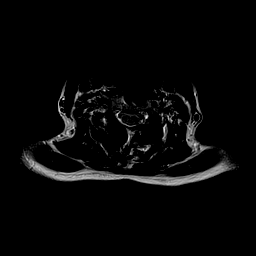
[im 12/23]
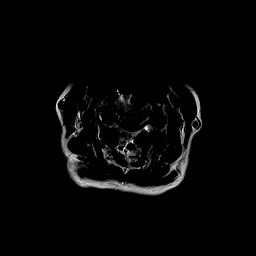
[im 13/23]
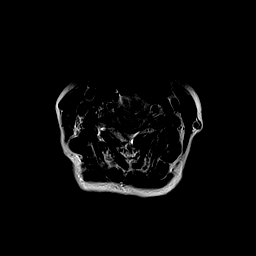
[im 15/23]
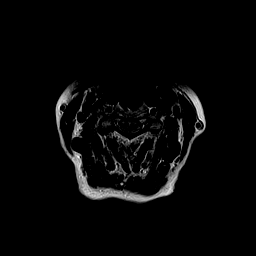
[im 19/23]
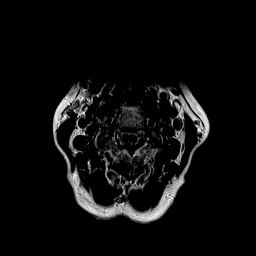
[im 23/23]
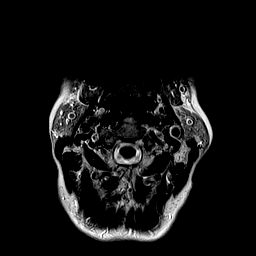

[30 of 48 positions shown; findings below may reference images not displayed]

FINDINGS: Alignment: Unchanged alignment without significant listhesis.

Vertebrae: C4-C6 ACDF and C5-6 posterior fusion again noted. No
fracture, suspicious osseous lesion, or significant marrow edema.

Cord: Normal signal and morphology.

Posterior Fossa, vertebral arteries, paraspinal tissues: Patchy T2
hyperintensity in the pons, similar to prior and nonspecific though
most often seen with chronic small vessel ischemia. Preserved
vertebral artery flow voids.

Disc levels:

C2-3: Negative.

C3-4: A central disc protrusion results in mild spinal stenosis
without neural foraminal stenosis, unchanged.

C4-5: Prior ACDF.  No stenosis.

C5-6: Prior ACDF and posterior fusion.  No stenosis.

C6-7: Left central disc protrusion results in mild spinal stenosis
without neural foraminal stenosis, unchanged.

C7-T1: Negative.
IMPRESSION: 1. Unchanged disc protrusions at C3-4 and C6-7 resulting in mild
spinal stenosis.
2. C4-C6 ACDF without stenosis.

## 2020-12-11 DIAGNOSIS — L603 Nail dystrophy: Secondary | ICD-10-CM | POA: Diagnosis not present

## 2020-12-11 DIAGNOSIS — G43909 Migraine, unspecified, not intractable, without status migrainosus: Secondary | ICD-10-CM | POA: Diagnosis not present

## 2020-12-11 DIAGNOSIS — E7800 Pure hypercholesterolemia, unspecified: Secondary | ICD-10-CM | POA: Diagnosis not present

## 2020-12-11 DIAGNOSIS — G4700 Insomnia, unspecified: Secondary | ICD-10-CM | POA: Diagnosis not present

## 2020-12-11 DIAGNOSIS — R609 Edema, unspecified: Secondary | ICD-10-CM | POA: Diagnosis not present

## 2020-12-23 DIAGNOSIS — Z1211 Encounter for screening for malignant neoplasm of colon: Secondary | ICD-10-CM | POA: Diagnosis not present

## 2020-12-23 DIAGNOSIS — Z1212 Encounter for screening for malignant neoplasm of rectum: Secondary | ICD-10-CM | POA: Diagnosis not present

## 2020-12-30 LAB — COLOGUARD: COLOGUARD: NEGATIVE

## 2021-02-14 ENCOUNTER — Other Ambulatory Visit: Payer: Self-pay | Admitting: Physician Assistant

## 2021-02-14 DIAGNOSIS — Z1231 Encounter for screening mammogram for malignant neoplasm of breast: Secondary | ICD-10-CM

## 2021-03-05 DIAGNOSIS — J019 Acute sinusitis, unspecified: Secondary | ICD-10-CM | POA: Diagnosis not present

## 2021-03-05 DIAGNOSIS — Z6829 Body mass index (BMI) 29.0-29.9, adult: Secondary | ICD-10-CM | POA: Diagnosis not present

## 2021-04-03 DIAGNOSIS — Z79899 Other long term (current) drug therapy: Secondary | ICD-10-CM | POA: Diagnosis not present

## 2021-04-03 DIAGNOSIS — I8393 Asymptomatic varicose veins of bilateral lower extremities: Secondary | ICD-10-CM | POA: Diagnosis not present

## 2021-04-03 DIAGNOSIS — M79661 Pain in right lower leg: Secondary | ICD-10-CM | POA: Diagnosis not present

## 2021-04-03 DIAGNOSIS — Z880 Allergy status to penicillin: Secondary | ICD-10-CM | POA: Diagnosis not present

## 2021-04-03 DIAGNOSIS — M79604 Pain in right leg: Secondary | ICD-10-CM | POA: Diagnosis not present

## 2021-04-03 DIAGNOSIS — M1711 Unilateral primary osteoarthritis, right knee: Secondary | ICD-10-CM | POA: Diagnosis not present

## 2021-04-03 DIAGNOSIS — Z6829 Body mass index (BMI) 29.0-29.9, adult: Secondary | ICD-10-CM | POA: Diagnosis not present

## 2021-04-03 DIAGNOSIS — R609 Edema, unspecified: Secondary | ICD-10-CM | POA: Diagnosis not present

## 2021-04-03 DIAGNOSIS — M7989 Other specified soft tissue disorders: Secondary | ICD-10-CM | POA: Diagnosis not present

## 2021-04-03 DIAGNOSIS — E785 Hyperlipidemia, unspecified: Secondary | ICD-10-CM | POA: Diagnosis not present

## 2021-04-03 DIAGNOSIS — M810 Age-related osteoporosis without current pathological fracture: Secondary | ICD-10-CM | POA: Diagnosis not present

## 2021-04-03 DIAGNOSIS — Z7983 Long term (current) use of bisphosphonates: Secondary | ICD-10-CM | POA: Diagnosis not present

## 2021-04-03 DIAGNOSIS — E7800 Pure hypercholesterolemia, unspecified: Secondary | ICD-10-CM | POA: Diagnosis not present

## 2021-04-10 DIAGNOSIS — J069 Acute upper respiratory infection, unspecified: Secondary | ICD-10-CM | POA: Diagnosis not present

## 2021-04-26 ENCOUNTER — Inpatient Hospital Stay: Payer: Self-pay

## 2021-05-31 DIAGNOSIS — Z13820 Encounter for screening for osteoporosis: Secondary | ICD-10-CM | POA: Diagnosis not present

## 2021-05-31 DIAGNOSIS — Z6828 Body mass index (BMI) 28.0-28.9, adult: Secondary | ICD-10-CM | POA: Diagnosis not present

## 2021-05-31 DIAGNOSIS — Z01419 Encounter for gynecological examination (general) (routine) without abnormal findings: Secondary | ICD-10-CM | POA: Diagnosis not present

## 2021-05-31 DIAGNOSIS — M8580 Other specified disorders of bone density and structure, unspecified site: Secondary | ICD-10-CM | POA: Diagnosis not present

## 2021-05-31 DIAGNOSIS — N952 Postmenopausal atrophic vaginitis: Secondary | ICD-10-CM | POA: Diagnosis not present

## 2021-06-03 DIAGNOSIS — S39012A Strain of muscle, fascia and tendon of lower back, initial encounter: Secondary | ICD-10-CM | POA: Diagnosis not present

## 2021-06-14 DIAGNOSIS — E7800 Pure hypercholesterolemia, unspecified: Secondary | ICD-10-CM | POA: Diagnosis not present

## 2021-06-14 DIAGNOSIS — G43909 Migraine, unspecified, not intractable, without status migrainosus: Secondary | ICD-10-CM | POA: Diagnosis not present

## 2021-06-14 DIAGNOSIS — Z79899 Other long term (current) drug therapy: Secondary | ICD-10-CM | POA: Diagnosis not present

## 2021-06-14 DIAGNOSIS — R609 Edema, unspecified: Secondary | ICD-10-CM | POA: Diagnosis not present

## 2021-06-14 DIAGNOSIS — G4700 Insomnia, unspecified: Secondary | ICD-10-CM | POA: Diagnosis not present

## 2021-06-20 DIAGNOSIS — Z6828 Body mass index (BMI) 28.0-28.9, adult: Secondary | ICD-10-CM | POA: Insufficient documentation

## 2021-06-20 DIAGNOSIS — M4722 Other spondylosis with radiculopathy, cervical region: Secondary | ICD-10-CM | POA: Diagnosis not present

## 2021-06-21 ENCOUNTER — Other Ambulatory Visit: Payer: Self-pay | Admitting: Neurological Surgery

## 2021-06-21 DIAGNOSIS — M4722 Other spondylosis with radiculopathy, cervical region: Secondary | ICD-10-CM

## 2021-06-27 ENCOUNTER — Other Ambulatory Visit: Payer: Self-pay

## 2021-06-27 ENCOUNTER — Ambulatory Visit
Admission: RE | Admit: 2021-06-27 | Discharge: 2021-06-27 | Disposition: A | Discharge status: Home / Self Care | Payer: BC Managed Care – PPO | Source: Ambulatory Visit | Attending: Physician Assistant | Admitting: Physician Assistant

## 2021-06-27 DIAGNOSIS — Z1231 Encounter for screening mammogram for malignant neoplasm of breast: Secondary | ICD-10-CM

## 2021-06-30 ENCOUNTER — Ambulatory Visit
Admission: RE | Admit: 2021-06-30 | Discharge: 2021-06-30 | Disposition: A | Discharge status: Home / Self Care | Payer: BC Managed Care – PPO | Source: Ambulatory Visit | Attending: Neurological Surgery | Admitting: Neurological Surgery

## 2021-06-30 DIAGNOSIS — M4722 Other spondylosis with radiculopathy, cervical region: Secondary | ICD-10-CM

## 2021-06-30 DIAGNOSIS — M4802 Spinal stenosis, cervical region: Secondary | ICD-10-CM | POA: Diagnosis not present

## 2021-07-12 DIAGNOSIS — M4722 Other spondylosis with radiculopathy, cervical region: Secondary | ICD-10-CM | POA: Diagnosis not present

## 2021-07-23 DIAGNOSIS — M6281 Muscle weakness (generalized): Secondary | ICD-10-CM | POA: Diagnosis not present

## 2021-07-23 DIAGNOSIS — M50123 Cervical disc disorder at C6-C7 level with radiculopathy: Secondary | ICD-10-CM | POA: Diagnosis not present

## 2021-07-30 DIAGNOSIS — M50123 Cervical disc disorder at C6-C7 level with radiculopathy: Secondary | ICD-10-CM | POA: Diagnosis not present

## 2021-07-30 DIAGNOSIS — M6281 Muscle weakness (generalized): Secondary | ICD-10-CM | POA: Diagnosis not present

## 2021-08-01 DIAGNOSIS — M6281 Muscle weakness (generalized): Secondary | ICD-10-CM | POA: Diagnosis not present

## 2021-08-01 DIAGNOSIS — M50123 Cervical disc disorder at C6-C7 level with radiculopathy: Secondary | ICD-10-CM | POA: Diagnosis not present

## 2021-08-13 DIAGNOSIS — M2578 Osteophyte, vertebrae: Secondary | ICD-10-CM | POA: Diagnosis not present

## 2021-08-13 DIAGNOSIS — M4722 Other spondylosis with radiculopathy, cervical region: Secondary | ICD-10-CM | POA: Diagnosis not present

## 2021-08-13 DIAGNOSIS — Z981 Arthrodesis status: Secondary | ICD-10-CM | POA: Diagnosis not present

## 2021-09-07 DIAGNOSIS — M4722 Other spondylosis with radiculopathy, cervical region: Secondary | ICD-10-CM | POA: Diagnosis not present

## 2021-10-10 DIAGNOSIS — Z6830 Body mass index (BMI) 30.0-30.9, adult: Secondary | ICD-10-CM | POA: Insufficient documentation

## 2021-10-10 DIAGNOSIS — R030 Elevated blood-pressure reading, without diagnosis of hypertension: Secondary | ICD-10-CM | POA: Insufficient documentation

## 2021-11-14 DIAGNOSIS — M546 Pain in thoracic spine: Secondary | ICD-10-CM | POA: Diagnosis not present

## 2021-11-14 DIAGNOSIS — Z6830 Body mass index (BMI) 30.0-30.9, adult: Secondary | ICD-10-CM | POA: Diagnosis not present

## 2021-11-27 DIAGNOSIS — R1031 Right lower quadrant pain: Secondary | ICD-10-CM | POA: Diagnosis not present

## 2021-11-27 DIAGNOSIS — N200 Calculus of kidney: Secondary | ICD-10-CM | POA: Diagnosis not present

## 2021-12-13 ENCOUNTER — Ambulatory Visit: Payer: BC Managed Care – PPO | Admitting: Podiatry

## 2021-12-13 ENCOUNTER — Encounter: Payer: Self-pay | Admitting: Podiatry

## 2021-12-13 ENCOUNTER — Ambulatory Visit (INDEPENDENT_AMBULATORY_CARE_PROVIDER_SITE_OTHER): Payer: BC Managed Care – PPO

## 2021-12-13 DIAGNOSIS — S99921A Unspecified injury of right foot, initial encounter: Secondary | ICD-10-CM

## 2021-12-13 NOTE — Progress Notes (Signed)
°  Subjective:  Patient ID: TYTEANNA OST, female    DOB: Oct 26, 1961,  MRN: [id number]  Chief Complaint  Patient presents with   Foot Pain    I had surgery back in 2019 on the right foot and I hit a cabinet door on Sunday and hurt the 3rd toe right and does hurt with shoes and some walking and tingles at times and some swelling    61 y.o. female presents with the above complaint. History confirmed with patient.   Objective:  Physical Exam: warm, good capillary refill, no trophic changes or ulcerative lesions, normal DP and PT pulses, and normal sensory exam. POP right 3rd toe, local bruising and contusion  No images are attached to the encounter.  Radiographs: X-ray of the right foot: no fracture, dislocation, swelling or degenerative changes noted. Healed osteotomy 2nd/3rd mets right in good position. Assessment:   1. Injury of toe on right foot, initial encounter    Plan:  Patient was evaluated and treated and all questions answered.  Contusion right 2nd toe -Educated on etiology -XR reviewed with patient. No fractures noted -Buddy splint 2nd/3rd toes for support -F/u PRN.  Return if symptoms worsen or fail to improve.

## 2021-12-24 DIAGNOSIS — R609 Edema, unspecified: Secondary | ICD-10-CM | POA: Diagnosis not present

## 2021-12-24 DIAGNOSIS — E7800 Pure hypercholesterolemia, unspecified: Secondary | ICD-10-CM | POA: Diagnosis not present

## 2021-12-24 DIAGNOSIS — R635 Abnormal weight gain: Secondary | ICD-10-CM | POA: Diagnosis not present

## 2021-12-24 DIAGNOSIS — M810 Age-related osteoporosis without current pathological fracture: Secondary | ICD-10-CM | POA: Diagnosis not present

## 2021-12-24 DIAGNOSIS — R5383 Other fatigue: Secondary | ICD-10-CM | POA: Diagnosis not present

## 2021-12-24 DIAGNOSIS — M47816 Spondylosis without myelopathy or radiculopathy, lumbar region: Secondary | ICD-10-CM | POA: Diagnosis not present

## 2021-12-24 DIAGNOSIS — Z79899 Other long term (current) drug therapy: Secondary | ICD-10-CM | POA: Diagnosis not present

## 2022-02-25 ENCOUNTER — Encounter: Payer: Self-pay | Admitting: Podiatry

## 2022-02-25 ENCOUNTER — Ambulatory Visit: Payer: BC Managed Care – PPO | Admitting: Podiatry

## 2022-02-25 DIAGNOSIS — M79675 Pain in left toe(s): Secondary | ICD-10-CM | POA: Diagnosis not present

## 2022-02-25 DIAGNOSIS — M7741 Metatarsalgia, right foot: Secondary | ICD-10-CM | POA: Diagnosis not present

## 2022-02-25 MED ORDER — GABAPENTIN 300 MG PO CAPS: 300.0000 mg | ORAL_CAPSULE | Freq: Every day | ORAL | 3 refills | Status: DC

## 2022-02-25 NOTE — Progress Notes (Signed)
?  Subjective:  ?Patient ID: [name], female    DOB: 1961/02/14,  MRN: [id number] ? ?Chief Complaint  ?Patient presents with  ? Foot Pain  ?  I stumped my 3rd toe on the right foot back in February and it feels just as bad now as it did when I had the surgery in 2019 and burns and tingling and I do elevate and there is a spot on the toe and I have been taken 3 ibuprofen and does not help  ? ?61 y.o. female presents for continued pain in the third right toe. Relates burning pain in the toes that worsens with activity. Has tried icing elevating. With no relief. Relates it is not broken form before.   ? ?Objective:  ?Physical Exam: ?warm, good capillary refill, no trophic changes or ulcerative lesions, normal DP and PT pulses, and normal sensory exam. POP right 3rd toe,swelling noted. Some pain sub second and third metatarsals.  ? ?No images are attached to the encounter. ? ?Radiographs: ?X-ray of the right foot: reviewed prior.  ?Assessment:  ? ?1. Toe pain, left   ?2. Metatarsalgia, right foot   ? ? ?Plan:  ?Patient was evaluated and treated and all questions answered. ? ?Contusion right 2nd toe/metatarsalgia  ?-Educated on etiology ?-Padding provided.  ?Prescription for gabapetnin provided.  ?Follow-up in 6 weeks.  ? ?Return in about 6 weeks (around 04/08/2022), or if symptoms worsen or fail to improve.  ? ?

## 2022-03-22 DIAGNOSIS — S29012A Strain of muscle and tendon of back wall of thorax, initial encounter: Secondary | ICD-10-CM | POA: Diagnosis not present

## 2022-03-22 DIAGNOSIS — M62830 Muscle spasm of back: Secondary | ICD-10-CM | POA: Diagnosis not present

## 2022-03-22 DIAGNOSIS — E785 Hyperlipidemia, unspecified: Secondary | ICD-10-CM | POA: Diagnosis not present

## 2022-03-22 DIAGNOSIS — M810 Age-related osteoporosis without current pathological fracture: Secondary | ICD-10-CM | POA: Diagnosis not present

## 2022-04-01 ENCOUNTER — Encounter: Payer: Self-pay | Admitting: Podiatry

## 2022-04-01 ENCOUNTER — Ambulatory Visit: Payer: BC Managed Care – PPO | Admitting: Podiatry

## 2022-04-01 DIAGNOSIS — M79675 Pain in left toe(s): Secondary | ICD-10-CM | POA: Diagnosis not present

## 2022-04-01 DIAGNOSIS — M7741 Metatarsalgia, right foot: Secondary | ICD-10-CM | POA: Diagnosis not present

## 2022-04-01 NOTE — Progress Notes (Signed)
  Subjective:  Patient ID: [name], female    DOB: 1961-10-05,  MRN: [id number]  No chief complaint on file.  61 y.o. female presents for continued pain in the third right toe. Relates she is doing about 90% better. Relates between the padding and the gabapentin her pain has been well controlled. Has tried icing elevating with no relief.  Objective:  Physical Exam: warm, good capillary refill, no trophic changes or ulcerative lesions, normal DP and PT pulses, and normal sensory exam. POP right 3rd toe,swelling noted. Some pain sub second interspace.   No images are attached to the encounter.  Radiographs: X-ray of the right foot: reviewed prior.  Assessment:   1. Toe pain, left   2. Metatarsalgia, right foot     Plan:  Patient was evaluated and treated and all questions answered.  Contusion right 2nd toe/metatarsalgia  -Educated on etiology -Padding provided.  Continue gabapentin Follow-up as needed.   No follow-ups on file.

## 2022-04-22 ENCOUNTER — Other Ambulatory Visit: Payer: Self-pay | Admitting: Physician Assistant

## 2022-04-22 DIAGNOSIS — Z1231 Encounter for screening mammogram for malignant neoplasm of breast: Secondary | ICD-10-CM

## 2022-05-08 DIAGNOSIS — M4722 Other spondylosis with radiculopathy, cervical region: Secondary | ICD-10-CM | POA: Diagnosis not present

## 2022-05-08 DIAGNOSIS — Z6828 Body mass index (BMI) 28.0-28.9, adult: Secondary | ICD-10-CM | POA: Diagnosis not present

## 2022-05-10 ENCOUNTER — Other Ambulatory Visit: Payer: Self-pay | Admitting: Neurological Surgery

## 2022-05-10 DIAGNOSIS — M4722 Other spondylosis with radiculopathy, cervical region: Secondary | ICD-10-CM

## 2022-05-19 ENCOUNTER — Ambulatory Visit
Admission: RE | Admit: 2022-05-19 | Discharge: 2022-05-19 | Disposition: A | Discharge status: Home / Self Care | Payer: BC Managed Care – PPO | Source: Ambulatory Visit | Attending: Neurological Surgery | Admitting: Neurological Surgery

## 2022-05-19 DIAGNOSIS — M47812 Spondylosis without myelopathy or radiculopathy, cervical region: Secondary | ICD-10-CM | POA: Diagnosis not present

## 2022-05-19 DIAGNOSIS — M4722 Other spondylosis with radiculopathy, cervical region: Secondary | ICD-10-CM

## 2022-05-19 DIAGNOSIS — M4802 Spinal stenosis, cervical region: Secondary | ICD-10-CM | POA: Diagnosis not present

## 2022-05-25 ENCOUNTER — Other Ambulatory Visit: Payer: BC Managed Care – PPO

## 2022-05-29 DIAGNOSIS — M4722 Other spondylosis with radiculopathy, cervical region: Secondary | ICD-10-CM | POA: Diagnosis not present

## 2022-06-12 DIAGNOSIS — M545 Low back pain, unspecified: Secondary | ICD-10-CM | POA: Diagnosis not present

## 2022-06-12 DIAGNOSIS — R109 Unspecified abdominal pain: Secondary | ICD-10-CM | POA: Diagnosis not present

## 2022-06-12 DIAGNOSIS — R1032 Left lower quadrant pain: Secondary | ICD-10-CM | POA: Diagnosis not present

## 2022-06-12 DIAGNOSIS — N200 Calculus of kidney: Secondary | ICD-10-CM | POA: Diagnosis not present

## 2022-06-17 DIAGNOSIS — R200 Anesthesia of skin: Secondary | ICD-10-CM | POA: Diagnosis not present

## 2022-06-21 DIAGNOSIS — M4722 Other spondylosis with radiculopathy, cervical region: Secondary | ICD-10-CM | POA: Diagnosis not present

## 2022-06-25 ENCOUNTER — Other Ambulatory Visit: Payer: Self-pay | Admitting: Neurological Surgery

## 2022-06-25 DIAGNOSIS — M4722 Other spondylosis with radiculopathy, cervical region: Secondary | ICD-10-CM

## 2022-06-27 ENCOUNTER — Other Ambulatory Visit: Payer: Self-pay | Admitting: Neurological Surgery

## 2022-06-27 DIAGNOSIS — M4722 Other spondylosis with radiculopathy, cervical region: Secondary | ICD-10-CM

## 2022-07-01 ENCOUNTER — Ambulatory Visit
Admission: RE | Admit: 2022-07-01 | Discharge: 2022-07-01 | Disposition: A | Discharge status: Home / Self Care | Payer: BC Managed Care – PPO | Source: Ambulatory Visit | Attending: Physician Assistant | Admitting: Physician Assistant

## 2022-07-01 DIAGNOSIS — M47816 Spondylosis without myelopathy or radiculopathy, lumbar region: Secondary | ICD-10-CM | POA: Diagnosis not present

## 2022-07-01 DIAGNOSIS — E7800 Pure hypercholesterolemia, unspecified: Secondary | ICD-10-CM | POA: Diagnosis not present

## 2022-07-01 DIAGNOSIS — Z79899 Other long term (current) drug therapy: Secondary | ICD-10-CM | POA: Diagnosis not present

## 2022-07-01 DIAGNOSIS — R7303 Prediabetes: Secondary | ICD-10-CM | POA: Diagnosis not present

## 2022-07-01 DIAGNOSIS — Z1231 Encounter for screening mammogram for malignant neoplasm of breast: Secondary | ICD-10-CM | POA: Diagnosis not present

## 2022-07-01 DIAGNOSIS — R609 Edema, unspecified: Secondary | ICD-10-CM | POA: Diagnosis not present

## 2022-07-03 ENCOUNTER — Other Ambulatory Visit: Payer: Self-pay | Admitting: Physician Assistant

## 2022-07-03 DIAGNOSIS — R928 Other abnormal and inconclusive findings on diagnostic imaging of breast: Secondary | ICD-10-CM

## 2022-07-10 ENCOUNTER — Other Ambulatory Visit: Payer: Self-pay | Admitting: Physician Assistant

## 2022-07-10 ENCOUNTER — Ambulatory Visit
Admission: RE | Admit: 2022-07-10 | Discharge: 2022-07-10 | Disposition: A | Discharge status: Home / Self Care | Payer: BC Managed Care – PPO | Source: Ambulatory Visit | Attending: Physician Assistant | Admitting: Physician Assistant

## 2022-07-10 ENCOUNTER — Ambulatory Visit
Admission: RE | Admit: 2022-07-10 | Discharge: 2022-07-10 | Disposition: A | Discharge status: Home / Self Care | Payer: BC Managed Care – PPO | Source: Ambulatory Visit | Attending: Neurological Surgery | Admitting: Neurological Surgery

## 2022-07-10 DIAGNOSIS — M4722 Other spondylosis with radiculopathy, cervical region: Secondary | ICD-10-CM

## 2022-07-10 DIAGNOSIS — R928 Other abnormal and inconclusive findings on diagnostic imaging of breast: Secondary | ICD-10-CM | POA: Diagnosis not present

## 2022-07-10 DIAGNOSIS — R200 Anesthesia of skin: Secondary | ICD-10-CM | POA: Diagnosis not present

## 2022-07-10 DIAGNOSIS — N6311 Unspecified lump in the right breast, upper outer quadrant: Secondary | ICD-10-CM | POA: Diagnosis not present

## 2022-07-10 DIAGNOSIS — M542 Cervicalgia: Secondary | ICD-10-CM | POA: Diagnosis not present

## 2022-07-10 DIAGNOSIS — N6489 Other specified disorders of breast: Secondary | ICD-10-CM

## 2022-07-10 DIAGNOSIS — R202 Paresthesia of skin: Secondary | ICD-10-CM | POA: Diagnosis not present

## 2022-07-17 ENCOUNTER — Ambulatory Visit
Admission: RE | Admit: 2022-07-17 | Discharge: 2022-07-17 | Disposition: A | Discharge status: Home / Self Care | Payer: BC Managed Care – PPO | Source: Ambulatory Visit | Attending: Physician Assistant | Admitting: Physician Assistant

## 2022-07-17 ENCOUNTER — Other Ambulatory Visit: Payer: BC Managed Care – PPO

## 2022-07-17 DIAGNOSIS — R928 Other abnormal and inconclusive findings on diagnostic imaging of breast: Secondary | ICD-10-CM | POA: Diagnosis not present

## 2022-07-17 DIAGNOSIS — N6489 Other specified disorders of breast: Secondary | ICD-10-CM

## 2022-07-17 DIAGNOSIS — M4722 Other spondylosis with radiculopathy, cervical region: Secondary | ICD-10-CM | POA: Diagnosis not present

## 2022-07-17 DIAGNOSIS — N6081 Other benign mammary dysplasias of right breast: Secondary | ICD-10-CM | POA: Diagnosis not present

## 2022-07-29 DIAGNOSIS — R748 Abnormal levels of other serum enzymes: Secondary | ICD-10-CM | POA: Diagnosis not present

## 2022-08-06 ENCOUNTER — Ambulatory Visit: Payer: Self-pay | Admitting: General Surgery

## 2022-08-06 DIAGNOSIS — N6489 Other specified disorders of breast: Secondary | ICD-10-CM

## 2022-08-09 ENCOUNTER — Other Ambulatory Visit: Payer: Self-pay | Admitting: General Surgery

## 2022-08-09 DIAGNOSIS — N6489 Other specified disorders of breast: Secondary | ICD-10-CM

## 2022-08-26 DIAGNOSIS — M1711 Unilateral primary osteoarthritis, right knee: Secondary | ICD-10-CM | POA: Diagnosis not present

## 2022-08-30 ENCOUNTER — Encounter (HOSPITAL_BASED_OUTPATIENT_CLINIC_OR_DEPARTMENT_OTHER): Payer: Self-pay | Admitting: General Surgery

## 2022-08-30 ENCOUNTER — Other Ambulatory Visit: Payer: Self-pay

## 2022-09-04 ENCOUNTER — Encounter (HOSPITAL_BASED_OUTPATIENT_CLINIC_OR_DEPARTMENT_OTHER)
Admission: RE | Admit: 2022-09-04 | Discharge: 2022-09-04 | Disposition: A | Discharge status: Home / Self Care | Payer: BC Managed Care – PPO | Source: Ambulatory Visit | Attending: General Surgery | Admitting: General Surgery

## 2022-09-04 DIAGNOSIS — Z803 Family history of malignant neoplasm of breast: Secondary | ICD-10-CM | POA: Diagnosis not present

## 2022-09-04 DIAGNOSIS — N6489 Other specified disorders of breast: Secondary | ICD-10-CM | POA: Diagnosis not present

## 2022-09-04 DIAGNOSIS — Z01812 Encounter for preprocedural laboratory examination: Secondary | ICD-10-CM | POA: Diagnosis not present

## 2022-09-04 DIAGNOSIS — G8929 Other chronic pain: Secondary | ICD-10-CM | POA: Diagnosis not present

## 2022-09-04 DIAGNOSIS — M549 Dorsalgia, unspecified: Secondary | ICD-10-CM | POA: Diagnosis not present

## 2022-09-04 DIAGNOSIS — N6091 Unspecified benign mammary dysplasia of right breast: Secondary | ICD-10-CM | POA: Diagnosis not present

## 2022-09-04 LAB — BASIC METABOLIC PANEL
Anion gap: 5 (ref 5–15)
BUN: 17 mg/dL (ref 8–23)
CO2: 32 mmol/L (ref 22–32)
Calcium: 9.1 mg/dL (ref 8.9–10.3)
Chloride: 105 mmol/L (ref 98–111)
Creatinine, Ser: 0.58 mg/dL (ref 0.44–1.00)
GFR, Estimated: >60 mL/min (ref >60)
Glucose, Bld: 102 mg/dL — ABNORMAL HIGH (ref 70–99)
Potassium: 4.9 mmol/L (ref 3.5–5.1)
Sodium: 142 mmol/L (ref 135–145)

## 2022-09-04 NOTE — Progress Notes (Signed)
Ensure presurgery given with instruction to drink by 0645 DOS. CHG soap given with instruction taped to bottle. Written preop instruction sheet provided. Pt verbalized understanding      Enhanced Recovery after Surgery for Orthopedics Enhanced Recovery after Surgery is a protocol used to improve the stress on your body and your recovery after surgery.  Patient Instructions  The night before surgery:  No food after midnight. ONLY clear liquids after midnight  The day of surgery (if you do NOT have diabetes):  Drink ONE (1) Pre-Surgery Clear Ensure as directed.   This drink was given to you during your hospital  pre-op appointment visit. The pre-op nurse will instruct you on the time to drink the  Pre-Surgery Ensure depending on your surgery time. Finish the drink at the designated time by the pre-op nurse.  Nothing else to drink after completing the  Pre-Surgery Clear Ensure.  The day of surgery (if you have diabetes): Drink ONE (1) Gatorade 2 (G2) as directed. This drink was given to you during your hospital  pre-op appointment visit.  The pre-op nurse will instruct you on the time to drink the   Gatorade 2 (G2) depending on your surgery time. Color of the Gatorade may vary. Red is not allowed. Nothing else to drink after completing the  Gatorade 2 (G2).         If you have questions, please contact your surgeon's office.

## 2022-09-05 ENCOUNTER — Ambulatory Visit
Admission: RE | Admit: 2022-09-05 | Discharge: 2022-09-05 | Disposition: A | Discharge status: Home / Self Care | Payer: BC Managed Care – PPO | Source: Ambulatory Visit | Attending: General Surgery | Admitting: General Surgery

## 2022-09-05 DIAGNOSIS — R928 Other abnormal and inconclusive findings on diagnostic imaging of breast: Secondary | ICD-10-CM | POA: Diagnosis not present

## 2022-09-05 DIAGNOSIS — N6489 Other specified disorders of breast: Secondary | ICD-10-CM

## 2022-09-09 ENCOUNTER — Other Ambulatory Visit: Payer: Self-pay

## 2022-09-09 ENCOUNTER — Ambulatory Visit (HOSPITAL_BASED_OUTPATIENT_CLINIC_OR_DEPARTMENT_OTHER): Payer: BC Managed Care – PPO | Admitting: Certified Registered Nurse Anesthetist

## 2022-09-09 ENCOUNTER — Ambulatory Visit
Admission: RE | Admit: 2022-09-09 | Discharge: 2022-09-09 | Disposition: A | Discharge status: Home / Self Care | Payer: BC Managed Care – PPO | Source: Ambulatory Visit | Attending: General Surgery | Admitting: General Surgery

## 2022-09-09 ENCOUNTER — Encounter (HOSPITAL_BASED_OUTPATIENT_CLINIC_OR_DEPARTMENT_OTHER)
Admission: RE | Disposition: A | Discharge status: Home / Self Care | Payer: Self-pay | Source: Ambulatory Visit | Attending: General Surgery

## 2022-09-09 ENCOUNTER — Ambulatory Visit (HOSPITAL_BASED_OUTPATIENT_CLINIC_OR_DEPARTMENT_OTHER)
Admission: RE | Admit: 2022-09-09 | Discharge: 2022-09-09 | Disposition: A | Discharge status: Home / Self Care | Payer: BC Managed Care – PPO | Source: Ambulatory Visit | Attending: General Surgery | Admitting: General Surgery

## 2022-09-09 ENCOUNTER — Encounter (HOSPITAL_BASED_OUTPATIENT_CLINIC_OR_DEPARTMENT_OTHER): Payer: Self-pay | Admitting: General Surgery

## 2022-09-09 DIAGNOSIS — N62 Hypertrophy of breast: Secondary | ICD-10-CM | POA: Diagnosis not present

## 2022-09-09 DIAGNOSIS — Z803 Family history of malignant neoplasm of breast: Secondary | ICD-10-CM | POA: Diagnosis not present

## 2022-09-09 DIAGNOSIS — N6489 Other specified disorders of breast: Secondary | ICD-10-CM

## 2022-09-09 DIAGNOSIS — G8929 Other chronic pain: Secondary | ICD-10-CM | POA: Insufficient documentation

## 2022-09-09 DIAGNOSIS — N6091 Unspecified benign mammary dysplasia of right breast: Secondary | ICD-10-CM | POA: Diagnosis not present

## 2022-09-09 DIAGNOSIS — R928 Other abnormal and inconclusive findings on diagnostic imaging of breast: Secondary | ICD-10-CM | POA: Diagnosis not present

## 2022-09-09 DIAGNOSIS — R609 Edema, unspecified: Secondary | ICD-10-CM

## 2022-09-09 DIAGNOSIS — M549 Dorsalgia, unspecified: Secondary | ICD-10-CM | POA: Diagnosis not present

## 2022-09-09 DIAGNOSIS — N6011 Diffuse cystic mastopathy of right breast: Secondary | ICD-10-CM | POA: Diagnosis not present

## 2022-09-09 DIAGNOSIS — R920 Mammographic microcalcification found on diagnostic imaging of breast: Secondary | ICD-10-CM | POA: Diagnosis not present

## 2022-09-09 HISTORY — DX: Hyperlipidemia, unspecified: E78.5

## 2022-09-09 HISTORY — DX: Other chronic pain: G89.29

## 2022-09-09 HISTORY — DX: Headache, unspecified: R51.9

## 2022-09-09 HISTORY — PX: BREAST LUMPECTOMY WITH RADIOACTIVE SEED LOCALIZATION: SHX6424

## 2022-09-09 HISTORY — DX: Unspecified lump in the right breast, unspecified quadrant: N63.10

## 2022-09-09 SURGERY — BREAST LUMPECTOMY WITH RADIOACTIVE SEED LOCALIZATION
Anesthesia: General | Site: Breast | Laterality: Right

## 2022-09-09 MED ORDER — LIDOCAINE 2% (20 MG/ML) 5 ML SYRINGE
INTRAMUSCULAR | Status: DC | PRN
  Administered 2022-09-09: 60 mg via INTRAVENOUS

## 2022-09-09 MED ORDER — FENTANYL CITRATE (PF) 100 MCG/2ML IJ SOLN
25.0000 ug | INTRAMUSCULAR | Status: DC | PRN
  Administered 2022-09-09 (×2): 50 ug via INTRAVENOUS

## 2022-09-09 MED ORDER — TRAMADOL HCL 50 MG PO TABS: 50.0000 mg | ORAL_TABLET | Freq: Four times a day (QID) | ORAL | 0 refills | Status: AC | PRN

## 2022-09-09 MED ORDER — OXYCODONE HCL 5 MG PO TABS
ORAL_TABLET | ORAL | Status: AC
  Filled 2022-09-09: qty 1

## 2022-09-09 MED ORDER — ACETAMINOPHEN 500 MG PO TABS
1000.0000 mg | ORAL_TABLET | ORAL | Status: AC
  Administered 2022-09-09: 1000 mg via ORAL

## 2022-09-09 MED ORDER — LACTATED RINGERS IV SOLN: INTRAVENOUS | Status: DC

## 2022-09-09 MED ORDER — GABAPENTIN 300 MG PO CAPS
ORAL_CAPSULE | ORAL | Status: AC
  Filled 2022-09-09: qty 1

## 2022-09-09 MED ORDER — VANCOMYCIN HCL IN DEXTROSE 1-5 GM/200ML-% IV SOLN
INTRAVENOUS | Status: AC
  Filled 2022-09-09: qty 200

## 2022-09-09 MED ORDER — GABAPENTIN 300 MG PO CAPS
300.0000 mg | ORAL_CAPSULE | ORAL | Status: AC
  Administered 2022-09-09: 300 mg via ORAL

## 2022-09-09 MED ORDER — FENTANYL CITRATE (PF) 100 MCG/2ML IJ SOLN
INTRAMUSCULAR | Status: AC
  Filled 2022-09-09: qty 2

## 2022-09-09 MED ORDER — CELECOXIB 200 MG PO CAPS
200.0000 mg | ORAL_CAPSULE | ORAL | Status: AC
  Administered 2022-09-09: 200 mg via ORAL

## 2022-09-09 MED ORDER — CELECOXIB 200 MG PO CAPS
ORAL_CAPSULE | ORAL | Status: AC
  Filled 2022-09-09: qty 1

## 2022-09-09 MED ORDER — ONDANSETRON HCL 4 MG/2ML IJ SOLN
INTRAMUSCULAR | Status: DC | PRN
  Administered 2022-09-09: 4 mg via INTRAVENOUS

## 2022-09-09 MED ORDER — AMISULPRIDE (ANTIEMETIC) 5 MG/2ML IV SOLN: 10.0000 mg | Freq: Once | INTRAVENOUS | Status: DC | PRN

## 2022-09-09 MED ORDER — VANCOMYCIN HCL IN DEXTROSE 1-5 GM/200ML-% IV SOLN
1000.0000 mg | INTRAVENOUS | Status: AC
  Administered 2022-09-09: 1000 mg via INTRAVENOUS

## 2022-09-09 MED ORDER — CHLORHEXIDINE GLUCONATE CLOTH 2 % EX PADS: 6.0000 | MEDICATED_PAD | Freq: Once | CUTANEOUS | Status: DC

## 2022-09-09 MED ORDER — PROMETHAZINE HCL 25 MG/ML IJ SOLN: 6.2500 mg | INTRAMUSCULAR | Status: DC | PRN

## 2022-09-09 MED ORDER — VANCOMYCIN HCL 1000 MG IV SOLR
INTRAVENOUS | Status: DC | PRN
  Administered 2022-09-09: 1000 mg via INTRAVENOUS

## 2022-09-09 MED ORDER — BUPIVACAINE-EPINEPHRINE (PF) 0.25% -1:200000 IJ SOLN
INTRAMUSCULAR | Status: DC | PRN
  Administered 2022-09-09: 10 mL

## 2022-09-09 MED ORDER — PROPOFOL 10 MG/ML IV BOLUS
INTRAVENOUS | Status: DC | PRN
  Administered 2022-09-09: 140 mg via INTRAVENOUS

## 2022-09-09 MED ORDER — FENTANYL CITRATE (PF) 100 MCG/2ML IJ SOLN
INTRAMUSCULAR | Status: DC | PRN
  Administered 2022-09-09 (×2): 50 ug via INTRAVENOUS

## 2022-09-09 MED ORDER — ONDANSETRON HCL 4 MG/2ML IJ SOLN
INTRAMUSCULAR | Status: AC
  Filled 2022-09-09: qty 2

## 2022-09-09 MED ORDER — OXYCODONE HCL 5 MG/5ML PO SOLN: 5.0000 mg | Freq: Once | ORAL | Status: AC | PRN

## 2022-09-09 MED ORDER — OXYCODONE HCL 5 MG PO TABS
5.0000 mg | ORAL_TABLET | Freq: Once | ORAL | Status: AC | PRN
  Administered 2022-09-09: 5 mg via ORAL

## 2022-09-09 MED ORDER — ACETAMINOPHEN 500 MG PO TABS
ORAL_TABLET | ORAL | Status: AC
  Filled 2022-09-09: qty 2

## 2022-09-09 SURGICAL SUPPLY — 39 items
ADH SKN CLS APL DERMABOND .7 (GAUZE/BANDAGES/DRESSINGS) ×1
APL PRP STRL LF DISP 70% ISPRP (MISCELLANEOUS) ×1
APPLIER CLIP 9.375 MED OPEN (MISCELLANEOUS)
APR CLP MED 9.3 20 MLT OPN (MISCELLANEOUS)
BLADE SURG 15 STRL LF DISP TIS (BLADE) ×2 IMPLANT
BLADE SURG 15 STRL SS (BLADE) ×1
CANISTER SUC SOCK COL 7IN (MISCELLANEOUS) ×2 IMPLANT
CANISTER SUCT 1200ML W/VALVE (MISCELLANEOUS) ×2 IMPLANT
CHLORAPREP W/TINT 26 (MISCELLANEOUS) ×2 IMPLANT
CLIP APPLIE 9.375 MED OPEN (MISCELLANEOUS) IMPLANT
COVER BACK TABLE 60X90IN (DRAPES) ×2 IMPLANT
COVER MAYO STAND STRL (DRAPES) ×2 IMPLANT
COVER PROBE W GEL 5X96 (DRAPES) ×2 IMPLANT
DERMABOND ADVANCED .7 DNX12 (GAUZE/BANDAGES/DRESSINGS) ×2 IMPLANT
DRAPE LAPAROSCOPIC ABDOMINAL (DRAPES) ×2 IMPLANT
DRAPE UTILITY XL STRL (DRAPES) ×2 IMPLANT
ELECT COATED BLADE 2.86 ST (ELECTRODE) ×2 IMPLANT
ELECT REM PT RETURN 9FT ADLT (ELECTROSURGICAL) ×1
ELECTRODE REM PT RTRN 9FT ADLT (ELECTROSURGICAL) ×2 IMPLANT
GLOVE SURG SS PI 6.5 STRL IVOR (GLOVE) IMPLANT
GLOVE SURG SS PI 7.5 STRL IVOR (GLOVE) IMPLANT
GOWN STRL REUS W/ TWL LRG LVL3 (GOWN DISPOSABLE) ×4 IMPLANT
GOWN STRL REUS W/TWL LRG LVL3 (GOWN DISPOSABLE) ×2
KIT MARKER MARGIN INK (KITS) ×2 IMPLANT
NDL HYPO 25X1 1.5 SAFETY (NEEDLE) IMPLANT
NEEDLE HYPO 25X1 1.5 SAFETY (NEEDLE) ×1 IMPLANT
NS IRRIG 1000ML POUR BTL (IV SOLUTION) IMPLANT
PACK BASIN DAY SURGERY FS (CUSTOM PROCEDURE TRAY) ×2 IMPLANT
PENCIL SMOKE EVACUATOR (MISCELLANEOUS) ×2 IMPLANT
SLEEVE SCD COMPRESS KNEE MED (STOCKING) ×2 IMPLANT
SPONGE T-LAP 18X18 ~~LOC~~+RFID (SPONGE) ×2 IMPLANT
SUT MON AB 4-0 PC3 18 (SUTURE) ×2 IMPLANT
SUT SILK 2 0 SH (SUTURE) IMPLANT
SUT VICRYL 3-0 CR8 SH (SUTURE) ×2 IMPLANT
SYR CONTROL 10ML LL (SYRINGE) IMPLANT
TOWEL GREEN STERILE FF (TOWEL DISPOSABLE) ×2 IMPLANT
TRAY FAXITRON CT DISP (TRAY / TRAY PROCEDURE) ×2 IMPLANT
TUBE CONNECTING 20X1/4 (TUBING) ×2 IMPLANT
YANKAUER SUCT BULB TIP NO VENT (SUCTIONS) IMPLANT

## 2022-09-09 NOTE — Anesthesia Postprocedure Evaluation (Signed)
Anesthesia Post Note  Patient: [name]  Procedure(s) Performed: RIGHT BREAST LUMPECTOMY WITH RADIOACTIVE SEED LOCALIZATION (Right: Breast)     Patient location during evaluation: PACU Anesthesia Type: General Level of consciousness: awake Pain management: pain level controlled Vital Signs Assessment: post-procedure vital signs reviewed and stable Respiratory status: spontaneous breathing, nonlabored ventilation, respiratory function stable and patient connected to nasal cannula oxygen Cardiovascular status: blood pressure returned to baseline and stable Postop Assessment: no apparent nausea or vomiting Anesthetic complications: no   No notable events documented.  Last Vitals:  Vitals:   09/09/22 1115 09/09/22 1141  BP: 128/74 134/70  Pulse: 73 62  Resp: 14 16  Temp:  36.9 C  SpO2: 96% 98%    Last Pain:  Vitals:   09/09/22 1139  TempSrc:   PainSc: 3                  Joan Avetisyan P Addysyn Fern

## 2022-09-09 NOTE — Discharge Instructions (Signed)
  Post Anesthesia Home Care Instructions  Activity: Get plenty of rest for the remainder of the day. A responsible individual must stay with you for 24 hours following the procedure.  For the next 24 hours, DO NOT: -Drive a car -Paediatric nurse -Drink alcoholic beverages -Take any medication unless instructed by your physician -Make any legal decisions or sign important papers.  Meals: Start with liquid foods such as gelatin or soup. Progress to regular foods as tolerated. Avoid greasy, spicy, heavy foods. If nausea and/or vomiting occur, drink only clear liquids until the nausea and/or vomiting subsides. Call your physician if vomiting continues.  Special Instructions/Symptoms: Your throat may feel dry or sore from the anesthesia or the breathing tube placed in your throat during surgery. If this causes discomfort, gargle with warm salt water. The discomfort should disappear within 24 hours.  If you had a scopolamine patch placed behind your ear for the management of post- operative nausea and/or vomiting:  1. The medication in the patch is effective for 72 hours, after which it should be removed.  Wrap patch in a tissue and discard in the trash. Wash hands thoroughly with soap and water. 2. You may remove the patch earlier than 72 hours if you experience unpleasant side effects which may include dry mouth, dizziness or visual disturbances. 3. Avoid touching the patch. Wash your hands with soap and water after contact with the patch.       Next dose of Tylenol may be given at 3p

## 2022-09-09 NOTE — Op Note (Signed)
09/09/2022  10:38 AM  PATIENT:  [name]  61 y.o. female  PRE-OPERATIVE DIAGNOSIS:  RIGHT BREAST CSL  POST-OPERATIVE DIAGNOSIS:  RIGHT BREAST CSL  PROCEDURE:  Procedure(s): RIGHT BREAST LUMPECTOMY WITH RADIOACTIVE SEED LOCALIZATION (Right)  SURGEON:  Surgeon(s) and Role:    * Jovita Kussmaul, MD - Primary  PHYSICIAN ASSISTANT:   ASSISTANTS: none   ANESTHESIA:   local and general  EBL:  minimal   BLOOD ADMINISTERED:none  DRAINS: none   LOCAL MEDICATIONS USED:  MARCAINE     SPECIMEN:  Source of Specimen:  right breast tissue with additional deep and superior margins  DISPOSITION OF SPECIMEN:  PATHOLOGY  COUNTS:  YES  TOURNIQUET:  * No tourniquets in log *  DICTATION: .Dragon Dictation  After informed consent was obtained the patient was brought to the operating room and placed in the supine position on the operating table.  After adequate induction of general anesthesia the patient's right breast was prepped with ChloraPrep, allowed to dry, and draped in usual sterile manner.  An appropriate timeout was performed.  Previously an I-125 seed was placed in the lower aspect of the right breast to mark an area of complex sclerosing lesion.  The neoprobe was set to I-125 in the area of radioactivity was readily identified.  The area around this was infiltrated with quarter percent Marcaine.  A curvilinear incision was made along the lower edge of the areola of the right breast with a 15 blade knife.  The incision was carried through the skin and subcutaneous tissue sharply with the electrocautery.  Dissection was then carried towards the radioactive seed under the direction of the neoprobe.  Once I more closely approached the radioactive seed I then removed the circular portion of breast tissue sharply with the electrocautery around the radioactive seed while checking the area of radioactivity frequently.  Once the specimen was removed it was oriented with the appropriate paint  colors.  A specimen radiograph was obtained that showed the clip and seed to be within the specimen.  I did elect to take an additional deep and superior margin and these were marked appropriately.  All of the tissue was sent to pathology for further evaluation.  Hemostasis was achieved using the Bovie electrocautery.  The wound was irrigated with saline and infiltrated with more quarter percent Marcaine.  The deep layer of the incision was closed with layers of interrupted 3-0 Vicryl stitches.  The skin was then closed with interrupted 4-0 Monocryl subcuticular stitches.  Dermabond dressings were applied.  The patient tolerated the procedure well.  At the end of the case all needle sponge and instrument counts were correct.  The patient was then awakened and taken recovery in stable condition.  PLAN OF CARE: Discharge to home after PACU  PATIENT DISPOSITION:  PACU - hemodynamically stable.   Delay start of Pharmacological VTE agent (>24hrs) due to surgical blood loss or risk of bleeding: not applicable

## 2022-09-09 NOTE — Anesthesia Preprocedure Evaluation (Addendum)
Anesthesia Evaluation  Patient identified by MRN, date of birth, ID band Patient awake    Reviewed: Allergy & Precautions, NPO status , Patient's Chart, lab work & pertinent test results  Airway Mallampati: II  TM Distance: >3 FB Neck ROM: Limited    Dental no notable dental hx.    Pulmonary neg pulmonary ROS,    Pulmonary exam normal        Cardiovascular negative cardio ROS Normal cardiovascular exam     Neuro/Psych  Headaches,  Neuromuscular disease negative psych ROS   GI/Hepatic negative GI ROS, Neg liver ROS,   Endo/Other  negative endocrine ROS  Renal/GU Renal disease     Musculoskeletal  (+) Arthritis , Chronic back pain   Abdominal   Peds  Hematology negative hematology ROS (+)   Anesthesia Other Findings RIGHT BREAST CSL  Reproductive/Obstetrics                            Anesthesia Physical Anesthesia Plan  ASA: 2  Anesthesia Plan: General   Post-op Pain Management:    Induction: Intravenous  PONV Risk Score and Plan: 3 and Dexamethasone, Ondansetron, Midazolam and Treatment may vary due to age or medical condition  Airway Management Planned: LMA  Additional Equipment:   Intra-op Plan:   Post-operative Plan: Extubation in OR  Informed Consent: I have reviewed the patients History and Physical, chart, labs and discussed the procedure including the risks, benefits and alternatives for the proposed anesthesia with the patient or authorized representative who has indicated his/her understanding and acceptance.     Dental advisory given  Plan Discussed with: CRNA  Anesthesia Plan Comments:        Anesthesia Quick Evaluation

## 2022-09-09 NOTE — Transfer of Care (Signed)
Immediate Anesthesia Transfer of Care Note  Patient: [name]  Procedure(s) Performed: RIGHT BREAST LUMPECTOMY WITH RADIOACTIVE SEED LOCALIZATION (Right: Breast)  Patient Location: PACU  Anesthesia Type:General  Level of Consciousness: awake, alert  and oriented  Airway & Oxygen Therapy: Patient Spontanous Breathing  Post-op Assessment: Report given to RN and Post -op Vital signs reviewed and stable  Post vital signs: Reviewed and stable  Last Vitals:  Vitals Value Taken Time  BP 136/80 09/09/22 1045  Temp    Pulse 91 09/09/22 1046  Resp 17 09/09/22 1046  SpO2 96 % 09/09/22 1046  Vitals shown include unvalidated device data.  Last Pain:  Vitals:   09/09/22 0902  TempSrc: Oral  PainSc: 0-No pain      Patients Stated Pain Goal: 4 (56/70/14 1030)  Complications: No notable events documented.

## 2022-09-09 NOTE — Anesthesia Procedure Notes (Signed)
Procedure Name: LMA Insertion Date/Time: 09/09/2022 9:56 AM  Performed by: [place] Manus Rudd, CRNAPre-anesthesia Checklist: Patient identified, Emergency Drugs available, Suction available and Patient being monitored Patient Re-evaluated:Patient Re-evaluated prior to induction Oxygen Delivery Method: Circle system utilized Preoxygenation: Pre-oxygenation with 100% oxygen Induction Type: IV induction Ventilation: Mask ventilation without difficulty LMA: LMA inserted LMA Size: 4.0 Number of attempts: 1 Airway Equipment and Method: Bite block Placement Confirmation: positive ETCO2 Tube secured with: Tape Dental Injury: Teeth and Oropharynx as per pre-operative assessment

## 2022-09-09 NOTE — Interval H&P Note (Signed)
History and Physical Interval Note:  09/09/2022 9:30 AM  [name]  has presented today for surgery, with the diagnosis of RIGHT BREAST CSL.  The various methods of treatment have been discussed with the patient and family. After consideration of risks, benefits and other options for treatment, the patient has consented to  Procedure(s): RIGHT BREAST LUMPECTOMY WITH RADIOACTIVE SEED LOCALIZATION (Right) as a surgical intervention.  The patient's history has been reviewed, patient examined, no change in status, stable for surgery.  I have reviewed the patient's chart and labs.  Questions were answered to the patient's satisfaction.     Autumn Messing III

## 2022-09-09 NOTE — H&P (Signed)
REFERRING PHYSICIAN: Heath Gold, [place]  PROVIDER: Landry Corporal, MD  MRN: [id number] DOB: 04/10/1961 Subjective   Chief Complaint: New Consultation (Breast Right complex sclerosing)   History of Present Illness: [name] is a 61 y.o. female who is seen today as an office consultation for evaluation of New Consultation (Breast Right complex sclerosing) .   We are asked to see the patient in consultation by Dr. Cyndi Bender to evaluate her for a complex sclerosing lesion of the right breast. The patient is a 61 year old white female who recently went for a routine screening mammogram. At that time she was found to have a 1 cm area of distortion in the lower outer quadrant of the right breast. This was biopsied and came back as a complex sclerosing lesion. She does have a family history of breast cancer in a grandmother and maternal aunt. She is otherwise in good health and does not smoke.  Review of Systems: A complete review of systems was obtained from the patient. I have reviewed this information and discussed as appropriate with the patient. See HPI as well for other ROS.  ROS   Medical History: Past Medical History:  Diagnosis Date  Hyperlipidemia   Patient Active Problem List  Diagnosis  Complex sclerosing lesion of right breast   Past Surgical History:  Procedure Laterality Date  CHOLECYSTECTOMY OPEN 2021  kidney stone  2020  lumbor fusion  2009,2012,2014,2016  SPINE SURGERY    Allergies  Allergen Reactions  Erythromycin Other (See Comments)  Jaundice  Amoxicillin-Pot Clavulanate Nausea And Vomiting and Other (See Comments)   Current Outpatient Medications on File Prior to Visit  Medication Sig Dispense Refill  cyclobenzaprine (FLEXERIL) 5 MG tablet TAKE 1 TABLET BY MOUTH 1 TO 2 TIMES DAILY AS NEEDED FOR BACK PAIN  diazePAM (VALIUM) 2 MG tablet ONE TABLET UP TO TWICE A DAY AS NEEDED FOR MUSCLE SPASM IN NECK  FUROsemide (LASIX) 40 MG tablet   gabapentin (NEURONTIN) 300 MG capsule Take 300 mg by mouth at bedtime  rizatriptan (MAXALT-MLT) 10 MG disintegrating tablet 1 (ONE) TABLET DISPERSE EVERY 2 HRS X 3 DOSE AS NEEDED MIGRAINE (MAX 3 IN 24 HRS)  simvastatin (ZOCOR) 20 MG tablet  zolpidem (AMBIEN) 5 MG tablet TAKE 1/2 TO 1 TABLET BY MOUTH AT BEDTIME AS NEEDED FOR INSOMNIA   No current facility-administered medications on file prior to visit.   Family History  Problem Relation Age of Onset  Coronary Artery Disease (Blocked arteries around heart) Mother  Hyperlipidemia (Elevated cholesterol) Mother  Coronary Artery Disease (Blocked arteries around heart) Father  Coronary Artery Disease (Blocked arteries around heart) Brother    Social History   Tobacco Use  Smoking Status Never  Smokeless Tobacco Never    Social History   Socioeconomic History  Marital status: Married  Tobacco Use  Smoking status: Never  Smokeless tobacco: Never  Substance and Sexual Activity  Alcohol use: Never  Drug use: Never   Objective:   Vitals:  BP: (!) 140/70  Pulse: 102  Temp: 37 C (98.6 F)  SpO2: 98%  Weight: 74.8 kg (165 lb)  Height: 160 cm (5\' 3" )   Body mass index is 29.23 kg/m.  Physical Exam Vitals reviewed.  Constitutional:  General: She is not in acute distress. Appearance: Normal appearance.  HENT:  Head: Normocephalic and atraumatic.  Right Ear: External ear normal.  Left Ear: External ear normal.  Nose: Nose normal.  Mouth/Throat:  Mouth: Mucous membranes are moist.  Pharynx: Oropharynx is  clear.  Eyes:  General: No scleral icterus. Extraocular Movements: Extraocular movements intact.  Conjunctiva/sclera: Conjunctivae normal.  Pupils: Pupils are equal, round, and reactive to light.  Cardiovascular:  Rate and Rhythm: Normal rate and regular rhythm.  Pulses: Normal pulses.  Heart sounds: Normal heart sounds.  Pulmonary:  Effort: Pulmonary effort is normal. No respiratory distress.  Breath sounds:  Normal breath sounds.  Abdominal:  General: Bowel sounds are normal.  Palpations: Abdomen is soft.  Tenderness: There is no abdominal tenderness.  Musculoskeletal:  General: No swelling, tenderness or deformity. Normal range of motion.  Cervical back: Normal range of motion and neck supple.  Skin: General: Skin is warm and dry.  Coloration: Skin is not jaundiced.  Neurological:  General: No focal deficit present.  Mental Status: She is alert and oriented to person, place, and time.  Psychiatric:  Mood and Affect: Mood normal.  Behavior: Behavior normal.    Breast: There is no palpable mass in either breast. There is no palpable axillary, supraclavicular, or cervical lymphadenopathy.  Labs, Imaging and Diagnostic Testing:  Assessment and Plan:   Diagnoses and all orders for this visit:  Complex sclerosing lesion of right breast    The patient appears to have a area of complex sclerosing lesion in the lower outer quadrant of the right breast. Because there is a 5 to 10% chance of missing something more significant and because of the appearance of this area on mammogram the recommendation is to have this area removed. She would also like to have this done. I have discussed with her in detail the risks and benefits of the operation as well as some of the technical aspects including the use of a radioactive seed for localization and she understands and wishes to proceed.

## 2022-09-10 ENCOUNTER — Encounter (HOSPITAL_BASED_OUTPATIENT_CLINIC_OR_DEPARTMENT_OTHER): Payer: Self-pay | Admitting: General Surgery

## 2022-09-10 LAB — SURGICAL PATHOLOGY

## 2022-09-16 ENCOUNTER — Other Ambulatory Visit: Payer: Self-pay | Admitting: Physician Assistant

## 2022-09-16 DIAGNOSIS — R748 Abnormal levels of other serum enzymes: Secondary | ICD-10-CM

## 2022-09-30 ENCOUNTER — Ambulatory Visit
Admission: RE | Admit: 2022-09-30 | Discharge: 2022-09-30 | Disposition: A | Discharge status: Home / Self Care | Payer: BC Managed Care – PPO | Source: Ambulatory Visit | Attending: Physician Assistant | Admitting: Physician Assistant

## 2022-09-30 DIAGNOSIS — R748 Abnormal levels of other serum enzymes: Secondary | ICD-10-CM

## 2022-09-30 DIAGNOSIS — Z9049 Acquired absence of other specified parts of digestive tract: Secondary | ICD-10-CM | POA: Diagnosis not present

## 2022-11-28 ENCOUNTER — Encounter (HOSPITAL_COMMUNITY): Payer: Self-pay

## 2022-12-11 DIAGNOSIS — M542 Cervicalgia: Secondary | ICD-10-CM | POA: Diagnosis not present

## 2022-12-11 DIAGNOSIS — Z6829 Body mass index (BMI) 29.0-29.9, adult: Secondary | ICD-10-CM | POA: Diagnosis not present

## 2022-12-13 ENCOUNTER — Other Ambulatory Visit: Payer: Self-pay | Admitting: Neurological Surgery

## 2022-12-13 DIAGNOSIS — M542 Cervicalgia: Secondary | ICD-10-CM

## 2022-12-29 ENCOUNTER — Ambulatory Visit
Admission: RE | Admit: 2022-12-29 | Discharge: 2022-12-29 | Disposition: A | Discharge status: Home / Self Care | Payer: BC Managed Care – PPO | Source: Ambulatory Visit | Attending: Neurological Surgery | Admitting: Neurological Surgery

## 2022-12-29 DIAGNOSIS — M542 Cervicalgia: Secondary | ICD-10-CM | POA: Diagnosis not present

## 2023-01-01 DIAGNOSIS — Z6829 Body mass index (BMI) 29.0-29.9, adult: Secondary | ICD-10-CM | POA: Diagnosis not present

## 2023-01-01 DIAGNOSIS — M5481 Occipital neuralgia: Secondary | ICD-10-CM | POA: Diagnosis not present

## 2023-01-08 DIAGNOSIS — R7303 Prediabetes: Secondary | ICD-10-CM | POA: Diagnosis not present

## 2023-01-08 DIAGNOSIS — E7800 Pure hypercholesterolemia, unspecified: Secondary | ICD-10-CM | POA: Diagnosis not present

## 2023-01-08 DIAGNOSIS — K760 Fatty (change of) liver, not elsewhere classified: Secondary | ICD-10-CM | POA: Diagnosis not present

## 2023-01-08 DIAGNOSIS — R609 Edema, unspecified: Secondary | ICD-10-CM | POA: Diagnosis not present

## 2023-01-09 DIAGNOSIS — M542 Cervicalgia: Secondary | ICD-10-CM | POA: Diagnosis not present

## 2023-01-09 DIAGNOSIS — M5481 Occipital neuralgia: Secondary | ICD-10-CM | POA: Diagnosis not present

## 2023-01-27 DIAGNOSIS — M2560 Stiffness of unspecified joint, not elsewhere classified: Secondary | ICD-10-CM | POA: Diagnosis not present

## 2023-01-27 DIAGNOSIS — M542 Cervicalgia: Secondary | ICD-10-CM | POA: Diagnosis not present

## 2023-01-27 DIAGNOSIS — M6281 Muscle weakness (generalized): Secondary | ICD-10-CM | POA: Diagnosis not present

## 2023-02-06 DIAGNOSIS — M2560 Stiffness of unspecified joint, not elsewhere classified: Secondary | ICD-10-CM | POA: Diagnosis not present

## 2023-02-06 DIAGNOSIS — M6281 Muscle weakness (generalized): Secondary | ICD-10-CM | POA: Diagnosis not present

## 2023-02-06 DIAGNOSIS — M542 Cervicalgia: Secondary | ICD-10-CM | POA: Diagnosis not present

## 2023-02-20 DIAGNOSIS — M542 Cervicalgia: Secondary | ICD-10-CM | POA: Diagnosis not present

## 2023-02-20 DIAGNOSIS — M6281 Muscle weakness (generalized): Secondary | ICD-10-CM | POA: Diagnosis not present

## 2023-02-20 DIAGNOSIS — M2560 Stiffness of unspecified joint, not elsewhere classified: Secondary | ICD-10-CM | POA: Diagnosis not present

## 2023-03-24 DIAGNOSIS — Z6829 Body mass index (BMI) 29.0-29.9, adult: Secondary | ICD-10-CM | POA: Diagnosis not present

## 2023-03-24 DIAGNOSIS — Z1151 Encounter for screening for human papillomavirus (HPV): Secondary | ICD-10-CM | POA: Diagnosis not present

## 2023-03-24 DIAGNOSIS — Z01419 Encounter for gynecological examination (general) (routine) without abnormal findings: Secondary | ICD-10-CM | POA: Diagnosis not present

## 2023-03-24 DIAGNOSIS — Z1272 Encounter for screening for malignant neoplasm of vagina: Secondary | ICD-10-CM | POA: Diagnosis not present

## 2023-03-28 DIAGNOSIS — Z6829 Body mass index (BMI) 29.0-29.9, adult: Secondary | ICD-10-CM | POA: Diagnosis not present

## 2023-03-28 DIAGNOSIS — M4722 Other spondylosis with radiculopathy, cervical region: Secondary | ICD-10-CM | POA: Diagnosis not present

## 2023-05-02 DIAGNOSIS — N390 Urinary tract infection, site not specified: Secondary | ICD-10-CM | POA: Diagnosis not present

## 2023-05-08 ENCOUNTER — Other Ambulatory Visit: Payer: Self-pay | Admitting: Physician Assistant

## 2023-05-08 DIAGNOSIS — Z1231 Encounter for screening mammogram for malignant neoplasm of breast: Secondary | ICD-10-CM

## 2023-05-23 DIAGNOSIS — E785 Hyperlipidemia, unspecified: Secondary | ICD-10-CM | POA: Diagnosis not present

## 2023-05-23 DIAGNOSIS — M810 Age-related osteoporosis without current pathological fracture: Secondary | ICD-10-CM | POA: Diagnosis not present

## 2023-06-20 DIAGNOSIS — Z6829 Body mass index (BMI) 29.0-29.9, adult: Secondary | ICD-10-CM | POA: Diagnosis not present

## 2023-06-20 DIAGNOSIS — M5416 Radiculopathy, lumbar region: Secondary | ICD-10-CM | POA: Diagnosis not present

## 2023-06-20 DIAGNOSIS — M546 Pain in thoracic spine: Secondary | ICD-10-CM | POA: Diagnosis not present

## 2023-06-23 DIAGNOSIS — Z13820 Encounter for screening for osteoporosis: Secondary | ICD-10-CM | POA: Diagnosis not present

## 2023-07-07 ENCOUNTER — Ambulatory Visit
Admission: RE | Admit: 2023-07-07 | Discharge: 2023-07-07 | Disposition: A | Discharge status: Home / Self Care | Payer: BC Managed Care – PPO | Source: Ambulatory Visit | Attending: Physician Assistant | Admitting: Physician Assistant

## 2023-07-07 DIAGNOSIS — Z1231 Encounter for screening mammogram for malignant neoplasm of breast: Secondary | ICD-10-CM | POA: Diagnosis not present

## 2023-07-15 DIAGNOSIS — M5417 Radiculopathy, lumbosacral region: Secondary | ICD-10-CM | POA: Diagnosis not present

## 2023-07-15 DIAGNOSIS — M5116 Intervertebral disc disorders with radiculopathy, lumbar region: Secondary | ICD-10-CM | POA: Diagnosis not present

## 2023-08-07 DIAGNOSIS — R609 Edema, unspecified: Secondary | ICD-10-CM | POA: Diagnosis not present

## 2023-08-07 DIAGNOSIS — E7800 Pure hypercholesterolemia, unspecified: Secondary | ICD-10-CM | POA: Diagnosis not present

## 2023-08-07 DIAGNOSIS — R7303 Prediabetes: Secondary | ICD-10-CM | POA: Diagnosis not present

## 2023-08-07 DIAGNOSIS — Z1331 Encounter for screening for depression: Secondary | ICD-10-CM | POA: Diagnosis not present

## 2023-08-07 DIAGNOSIS — K760 Fatty (change of) liver, not elsewhere classified: Secondary | ICD-10-CM | POA: Diagnosis not present

## 2023-08-07 DIAGNOSIS — Z23 Encounter for immunization: Secondary | ICD-10-CM | POA: Diagnosis not present

## 2023-08-27 DIAGNOSIS — M5416 Radiculopathy, lumbar region: Secondary | ICD-10-CM | POA: Diagnosis not present

## 2023-08-27 DIAGNOSIS — Z6829 Body mass index (BMI) 29.0-29.9, adult: Secondary | ICD-10-CM | POA: Diagnosis not present

## 2023-08-29 ENCOUNTER — Other Ambulatory Visit: Payer: Self-pay | Admitting: Neurological Surgery

## 2023-08-29 DIAGNOSIS — M5416 Radiculopathy, lumbar region: Secondary | ICD-10-CM

## 2023-09-11 DIAGNOSIS — R2689 Other abnormalities of gait and mobility: Secondary | ICD-10-CM | POA: Diagnosis not present

## 2023-09-11 DIAGNOSIS — M5489 Other dorsalgia: Secondary | ICD-10-CM | POA: Diagnosis not present

## 2023-09-13 ENCOUNTER — Ambulatory Visit
Admission: RE | Admit: 2023-09-13 | Discharge: 2023-09-13 | Disposition: A | Discharge status: Home / Self Care | Payer: BC Managed Care – PPO | Source: Ambulatory Visit | Attending: Neurological Surgery | Admitting: Neurological Surgery

## 2023-09-13 DIAGNOSIS — M5416 Radiculopathy, lumbar region: Secondary | ICD-10-CM

## 2023-09-13 DIAGNOSIS — Z981 Arthrodesis status: Secondary | ICD-10-CM | POA: Diagnosis not present

## 2023-09-13 MED ORDER — GADOPICLENOL 0.5 MMOL/ML IV SOLN
7.5000 mL | Freq: Once | INTRAVENOUS | Status: AC | PRN
  Administered 2023-09-13: 7.5 mL via INTRAVENOUS

## 2023-09-18 DIAGNOSIS — R2689 Other abnormalities of gait and mobility: Secondary | ICD-10-CM | POA: Diagnosis not present

## 2023-09-18 DIAGNOSIS — M5489 Other dorsalgia: Secondary | ICD-10-CM | POA: Diagnosis not present

## 2023-09-26 DIAGNOSIS — M5489 Other dorsalgia: Secondary | ICD-10-CM | POA: Diagnosis not present

## 2023-09-26 DIAGNOSIS — M5416 Radiculopathy, lumbar region: Secondary | ICD-10-CM | POA: Diagnosis not present

## 2023-09-26 DIAGNOSIS — R2689 Other abnormalities of gait and mobility: Secondary | ICD-10-CM | POA: Diagnosis not present

## 2023-09-26 DIAGNOSIS — Z6829 Body mass index (BMI) 29.0-29.9, adult: Secondary | ICD-10-CM | POA: Diagnosis not present

## 2023-09-29 ENCOUNTER — Other Ambulatory Visit: Payer: Self-pay | Admitting: Neurological Surgery

## 2023-09-29 ENCOUNTER — Encounter: Payer: Self-pay | Admitting: Neurological Surgery

## 2023-09-29 DIAGNOSIS — M5416 Radiculopathy, lumbar region: Secondary | ICD-10-CM

## 2023-10-03 ENCOUNTER — Other Ambulatory Visit: Payer: BC Managed Care – PPO

## 2023-10-03 ENCOUNTER — Ambulatory Visit
Admission: RE | Admit: 2023-10-03 | Discharge: 2023-10-03 | Disposition: A | Discharge status: Home / Self Care | Payer: BC Managed Care – PPO | Source: Ambulatory Visit | Attending: Neurological Surgery | Admitting: Neurological Surgery

## 2023-10-03 DIAGNOSIS — Z981 Arthrodesis status: Secondary | ICD-10-CM | POA: Diagnosis not present

## 2023-10-03 DIAGNOSIS — M5416 Radiculopathy, lumbar region: Secondary | ICD-10-CM

## 2023-10-15 DIAGNOSIS — M5416 Radiculopathy, lumbar region: Secondary | ICD-10-CM | POA: Diagnosis not present

## 2023-10-15 DIAGNOSIS — Z6829 Body mass index (BMI) 29.0-29.9, adult: Secondary | ICD-10-CM | POA: Diagnosis not present

## 2023-10-20 ENCOUNTER — Other Ambulatory Visit: Payer: Self-pay | Admitting: Neurological Surgery

## 2023-10-23 NOTE — Pre-Procedure Instructions (Signed)
Surgical Instructions   Your procedure is scheduled on Thursday, December 19th. Report to [facility] Main Entrance "A" at 08:15 A.M., then check in with the Admitting office. Any questions or running late day of surgery: call [phone]  Questions prior to your surgery date: call [phone], Monday-Friday, 8am-4pm. If you experience any cold or flu symptoms such as cough, fever, chills, shortness of breath, etc. between now and your scheduled surgery, please notify us at the above number.     Remember:  Do not eat or drink after midnight the night before your surgery    Take these medicines the morning of surgery with A SIP OF WATER  cyclobenzaprine (FLEXERIL)    May take these medicines IF NEEDED: rizatriptan (MAXALT)  traMADol (ULTRAM)    One week prior to surgery, STOP taking any Aspirin (unless otherwise instructed by your surgeon) Aleve, Naproxen, Ibuprofen, Motrin, Advil, Goody's, BC's, all herbal medications, fish oil, and non-prescription vitamins.                     Do NOT Smoke (Tobacco/Vaping) for 24 hours prior to your procedure.  If you use a CPAP at night, you may bring your mask/headgear for your overnight stay.   You will be asked to remove any contacts, glasses, piercing's, hearing aid's, dentures/partials prior to surgery. Please bring cases for these items if needed.    Patients discharged the day of surgery will not be allowed to drive home, and someone needs to stay with them for 24 hours.  SURGICAL WAITING ROOM VISITATION Patients may have no more than 2 support people in the waiting area - these visitors may rotate.   Pre-op nurse will coordinate an appropriate time for 1 ADULT support person, who may not rotate, to accompany patient in pre-op.  Children under the age of 77 must have an adult with them who is not the patient and must remain in the main waiting area with an adult.  If the patient needs to stay at the hospital during part of their  recovery, the visitor guidelines for inpatient rooms apply.  Please refer to the [facility] website for the visitor guidelines for any additional information.   If you received a COVID test during your pre-op visit  it is requested that you wear a mask when out in public, stay away from anyone that may not be feeling well and notify your surgeon if you develop symptoms. If you have been in contact with anyone that has tested positive in the last 10 days please notify you surgeon.      Pre-operative 5 CHG Bathing Instructions   You can play a key role in reducing the risk of infection after surgery. Your skin needs to be as free of germs as possible. You can reduce the number of germs on your skin by washing with CHG (chlorhexidine gluconate) soap before surgery. CHG is an antiseptic soap that kills germs and continues to kill germs even after washing.   DO NOT use if you have an allergy to chlorhexidine/CHG or antibacterial soaps. If your skin becomes reddened or irritated, stop using the CHG and notify one of our RNs at [phone].   Please shower with the CHG soap starting 4 days before surgery using the following schedule:     Please keep in mind the following:  DO NOT shave, including legs and underarms, starting the day of your first shower.   You may shave your face at any point before/day  of surgery.  Place clean sheets on your bed the day you start using CHG soap. Use a clean washcloth (not used since being washed) for each shower. DO NOT sleep with pets once you start using the CHG.   CHG Shower Instructions:  Wash your face and private area with normal soap. If you choose to wash your hair, wash first with your normal shampoo.  After you use shampoo/soap, rinse your hair and body thoroughly to remove shampoo/soap residue.  Turn the water OFF and apply about 3 tablespoons (45 ml) of CHG soap to a CLEAN washcloth.  Apply CHG soap ONLY FROM YOUR NECK DOWN TO YOUR TOES  (washing for 3-5 minutes)  DO NOT use CHG soap on face, private areas, open wounds, or sores.  Pay special attention to the area where your surgery is being performed.  If you are having back surgery, having someone wash your back for you may be helpful. Wait 2 minutes after CHG soap is applied, then you may rinse off the CHG soap.  Pat dry with a clean towel  Put on clean clothes/pajamas   If you choose to wear lotion, please use ONLY the CHG-compatible lotions on the back of this paper.   Additional instructions for the day of surgery: DO NOT APPLY any lotions, deodorants, cologne, or perfumes.   Do not bring valuables to the hospital. [facility] is not responsible for any belongings/valuables. Do not wear nail polish, gel polish, artificial nails, or any other type of covering on natural nails (fingers and toes) Do not wear jewelry or makeup Put on clean/comfortable clothes.  Please brush your teeth.  Ask your nurse before applying any prescription medications to the skin.     CHG Compatible Lotions   Aveeno Moisturizing lotion  Cetaphil Moisturizing Cream  Cetaphil Moisturizing Lotion  Clairol Herbal Essence Moisturizing Lotion, Dry Skin  Clairol Herbal Essence Moisturizing Lotion, Extra Dry Skin  Clairol Herbal Essence Moisturizing Lotion, Normal Skin  Curel Age Defying Therapeutic Moisturizing Lotion with Alpha Hydroxy  Curel Extreme Care Body Lotion  Curel Soothing Hands Moisturizing Hand Lotion  Curel Therapeutic Moisturizing Cream, Fragrance-Free  Curel Therapeutic Moisturizing Lotion, Fragrance-Free  Curel Therapeutic Moisturizing Lotion, Original Formula  Eucerin Daily Replenishing Lotion  Eucerin Dry Skin Therapy Plus Alpha Hydroxy Crme  Eucerin Dry Skin Therapy Plus Alpha Hydroxy Lotion  Eucerin Original Crme  Eucerin Original Lotion  Eucerin Plus Crme Eucerin Plus Lotion  Eucerin TriLipid Replenishing Lotion  Keri Anti-Bacterial Hand Lotion  Keri Deep  Conditioning Original Lotion Dry Skin Formula Softly Scented  Keri Deep Conditioning Original Lotion, Fragrance Free Sensitive Skin Formula  Keri Lotion Fast Absorbing Fragrance Free Sensitive Skin Formula  Keri Lotion Fast Absorbing Softly Scented Dry Skin Formula  Keri Original Lotion  Keri Skin Renewal Lotion Keri Silky Smooth Lotion  Keri Silky Smooth Sensitive Skin Lotion  Nivea Body Creamy Conditioning Oil  Nivea Body Extra Enriched Lotion  Nivea Body Original Lotion  Nivea Body Sheer Moisturizing Lotion Nivea Crme  Nivea Skin Firming Lotion  NutraDerm 30 Skin Lotion  NutraDerm Skin Lotion  NutraDerm Therapeutic Skin Cream  NutraDerm Therapeutic Skin Lotion  ProShield Protective Hand Cream  Provon moisturizing lotion  Please read over the following fact sheets that you were given.

## 2023-10-24 ENCOUNTER — Other Ambulatory Visit: Payer: Self-pay

## 2023-10-24 ENCOUNTER — Encounter (HOSPITAL_COMMUNITY): Payer: Self-pay

## 2023-10-24 ENCOUNTER — Encounter (HOSPITAL_COMMUNITY)
Admission: RE | Admit: 2023-10-24 | Discharge: 2023-10-24 | Disposition: A | Discharge status: Home / Self Care | Payer: BC Managed Care – PPO | Source: Ambulatory Visit | Attending: Neurological Surgery | Admitting: Neurological Surgery

## 2023-10-24 VITALS — BP 122/59 | HR 70 | Temp 98.1°F | Resp 18 | Ht 64.0 in | Wt 167.2 lb

## 2023-10-24 DIAGNOSIS — R030 Elevated blood-pressure reading, without diagnosis of hypertension: Secondary | ICD-10-CM | POA: Diagnosis not present

## 2023-10-24 DIAGNOSIS — Z01818 Encounter for other preprocedural examination: Secondary | ICD-10-CM | POA: Insufficient documentation

## 2023-10-24 DIAGNOSIS — I447 Left bundle-branch block, unspecified: Secondary | ICD-10-CM | POA: Insufficient documentation

## 2023-10-24 HISTORY — DX: Personal history of urinary calculi: Z87.442

## 2023-10-24 LAB — BASIC METABOLIC PANEL
Anion gap: 10 (ref 5–15)
BUN: 15 mg/dL (ref 8–23)
CO2: 27 mmol/L (ref 22–32)
Calcium: 9.7 mg/dL (ref 8.9–10.3)
Chloride: 105 mmol/L (ref 98–111)
Creatinine, Ser: 0.67 mg/dL (ref 0.44–1.00)
GFR, Estimated: >60 mL/min (ref >60)
Glucose, Bld: 103 mg/dL — ABNORMAL HIGH (ref 70–99)
Potassium: 3.2 mmol/L — ABNORMAL LOW (ref 3.5–5.1)
Sodium: 142 mmol/L (ref 135–145)

## 2023-10-24 LAB — CBC
HCT: 40 % (ref 36.0–46.0)
Hemoglobin: 12.7 g/dL (ref 12.0–15.0)
MCH: 28.4 pg (ref 26.0–34.0)
MCHC: 31.8 g/dL (ref 30.0–36.0)
MCV: 89.5 fL (ref 80.0–100.0)
Platelets: 214 10*3/uL (ref 150–400)
RBC: 4.47 MIL/uL (ref 3.87–5.11)
RDW: 14.1 % (ref 11.5–15.5)
WBC: 6.7 10*3/uL (ref 4.0–10.5)
nRBC: 0 % (ref 0.0–0.2)

## 2023-10-24 LAB — SURGICAL PCR SCREEN
MRSA, PCR: POSITIVE — AB
Staphylococcus aureus: POSITIVE — AB

## 2023-10-24 NOTE — Progress Notes (Signed)
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

## 2023-10-24 NOTE — Progress Notes (Signed)
PCP - Lonie Peak  Cardiologist -  denies ( last office 2009 and was told she did not need return per patient)  PPM/ICD - denies Device Orders - n/a Rep Notified - n/a  Chest x-ray - denies EKG - 10-24-23 Stress Test - denies ECHO - denies Cardiac Cath - denies  Sleep Study - denies CPAP - n/a  DM - denies  Blood Thinner Instructions:denies Aspirin Instructions:n/a  ERAS Protcol -NPO   COVID TEST- yes   Anesthesia review: yes EKG review  Patient denies shortness of breath, fever, cough and chest pain at PAT appointment   All instructions explained to the patient, with a verbal understanding of the material. Patient agrees to go over the instructions while at home for a better understanding. Patient also instructed to self quarantine after being tested for COVID-19. The opportunity to ask questions was provided.

## 2023-10-30 ENCOUNTER — Ambulatory Visit (HOSPITAL_COMMUNITY)
Admission: RE | Admit: 2023-10-30 | Payer: BC Managed Care – PPO | Source: Home / Self Care | Admitting: Neurological Surgery

## 2023-10-30 ENCOUNTER — Encounter (HOSPITAL_COMMUNITY): Payer: Self-pay | Admitting: Physician Assistant

## 2023-10-30 DIAGNOSIS — I447 Left bundle-branch block, unspecified: Secondary | ICD-10-CM | POA: Diagnosis not present

## 2023-10-30 DIAGNOSIS — A4902 Methicillin resistant Staphylococcus aureus infection, unspecified site: Secondary | ICD-10-CM | POA: Diagnosis not present

## 2023-10-30 SURGERY — DECOMPRESSIVE LUMBAR LAMINECTOMY LEVEL 1
Anesthesia: General | Laterality: Left

## 2023-10-31 ENCOUNTER — Encounter: Payer: Self-pay | Admitting: *Deleted

## 2023-10-31 ENCOUNTER — Encounter: Payer: Self-pay | Admitting: Cardiology

## 2023-10-31 ENCOUNTER — Ambulatory Visit: Payer: BC Managed Care – PPO | Attending: Cardiology | Admitting: Cardiology

## 2023-10-31 VITALS — BP 130/90 | HR 91 | Ht 64.0 in | Wt 164.6 lb

## 2023-10-31 DIAGNOSIS — Z01818 Encounter for other preprocedural examination: Secondary | ICD-10-CM

## 2023-10-31 DIAGNOSIS — R011 Cardiac murmur, unspecified: Secondary | ICD-10-CM

## 2023-10-31 DIAGNOSIS — I447 Left bundle-branch block, unspecified: Secondary | ICD-10-CM | POA: Diagnosis not present

## 2023-10-31 MED ORDER — ROSUVASTATIN CALCIUM 20 MG PO TABS: 20.0000 mg | ORAL_TABLET | Freq: Every day | ORAL | 3 refills | Status: AC

## 2023-10-31 NOTE — Patient Instructions (Addendum)
Medication Instructions:  Please discontinue Simvastatin and start Crestor 20 mg a day. Continue all other medications as listed.  *If you need a refill on your cardiac medications before your next appointment, please call your pharmacy*  Testing/Procedures: Your physician has requested that you have an echocardiogram. Echocardiography is a painless test that uses sound waves to create images of your heart. It provides your doctor with information about the size and shape of your heart and how well your heart's chambers and valves are working. This procedure takes approximately one hour. There are no restrictions for this procedure. Please do NOT wear cologne, perfume, aftershave, or lotions (deodorant is allowed). Please arrive 15 minutes prior to your appointment time.  Please note: We ask at that you not bring children with you during ultrasound (echo/ vascular) testing. Due to room size and safety concerns, children are not allowed in the ultrasound rooms during exams. Our front office staff cannot provide observation of children in our lobby area while testing is being conducted. An adult accompanying a patient to their appointment will only be allowed in the ultrasound room at the discretion of the ultrasound technician under special circumstances. We apologize for any inconvenience.  Your physician has requested that you have a lexiscan myoview. For further information please visit [email or web address] Please follow instruction sheet, as given.  Follow-Up: At [facility], you and your health needs are our priority.  As part of our continuing mission to provide you with exceptional heart care, we have created designated Provider Care Teams.  These Care Teams include your primary Cardiologist (physician) and Advanced Practice Providers (APPs -  Physician Assistants and Nurse Practitioners) who all work together to provide you with the care you need, when you need it.  We recommend  signing up for the patient portal called "MyChart".  Sign up information is provided on this After Visit Summary.  MyChart is used to connect with patients for Virtual Visits (Telemedicine).  Patients are able to view lab/test results, encounter notes, upcoming appointments, etc.  Non-urgent messages can be sent to your provider as well.   To learn more about what you can do with MyChart, go to [email or web address].    Your next appointment:   Follow up will be based on the results of the above testing.

## 2023-10-31 NOTE — Progress Notes (Signed)
Cardiology Office Note:  .   Date:  10/31/2023  ID:  Elie Goody, DOB 04/13/1961, MRN [id number] PCP: Lonie Peak, PA-C  [facility] Providers Cardiologist:  Donato Schultz, MD     History of Present Illness: .   [name] is a 62 y.o. female Discussed with the use of AI scribe   History of Present Illness   A 62 year old patient with a history of hyperlipidemia, lumbar fusions, and right breast lumpectomy presents for preoperative cardiac risk stratification prior to lumbar spine surgery. The patient reports no significant chest discomfort or syncope. However, a prior EKG revealed a left bundle branch block. The patient's LDL is 114, triglycerides 84, hemoglobin A1c 5.6, hemoglobin 12.7, creatinine 0.67, ALT 12, and TSH 1.06.  The patient has been experiencing worsening issues with her lumbar spine, leading to the scheduled surgery. The patient has a history of lumbar fusion surgeries in 2009, 2012, 2014, and 2016. The patient denies smoking or drinking. There is a family history of heart disease, including the patient's paternal grandmother, mother, father, and brother.  The patient has been on simvastatin for elevated cholesterol for several years, with recent labs showing a slight increase in cholesterol levels. The patient also reports leg swelling during the day, which is managed with daily furosemide. The patient has been on simvastatin 20mg  for hyperlipidemia, which is being considered for a switch to rosuvastatin 20mg .           Studies Reviewed: .        Results   LABS LDL: 114 (08/07/2023) Triglycerides: 84 (08/07/2023) HbA1c: 5.6 Hb: 12.7 Cr: 0.67 ALT: 12 TSH: 1.06  DIAGNOSTIC EKG: Left bundle branch block, heart rate 71 bpm (10/24/2023)     Risk Assessment/Calculations:           Physical Exam:   VS:  BP (!) 130/90   Pulse 91   Ht 5\' 4"  (1.626 m)   Wt 164 lb 9.6 oz (74.7 kg)   SpO2 98%   BMI 28.25 kg/m    Wt Readings from Last 3  Encounters:  10/31/23 164 lb 9.6 oz (74.7 kg)  10/24/23 167 lb 3.2 oz (75.8 kg)  09/09/22 162 lb 0.6 oz (73.5 kg)    GEN: Well nourished, well developed in no acute distress NECK: No JVD; No carotid bruits CARDIAC: RRR, no murmurs, no rubs, no gallops RESPIRATORY:  Clear to auscultation without rales, wheezing or rhonchi  ABDOMEN: Soft, non-tender, non-distended EXTREMITIES:  No edema; No deformity   ASSESSMENT AND PLAN: .    Assessment and Plan    Preoperative Cardiac Risk Stratification   62 year old scheduled for lumbar spine surgery with a history of left bundle branch block (LBBB) noted on a 2009 EKG. LBBB can be associated with coronary artery disease, necessitating further evaluation.  Discussed the need for a Lexiscan stress test and echocardiogram to rule out cardiac issues before surgery.   - Order Lexiscan stress test   - Order echocardiogram    Left Bundle Branch Block (LBBB)   Chronic LBBB identified in 2009. No significant chest discomfort or syncope reported. LBBB can indicate underlying coronary artery disease, but not always. Further evaluation with stress test and echocardiogram is warranted. Discussed that LBBB is a conduction issue and not everyone with LBBB requires a pacemaker. Explained that if further conduction issues develop, a pacemaker might be needed.   - Order Lexiscan stress test   - Order echocardiogram (soft systolic murmur as well)  Hyperlipidemia  Managed with simvastatin 20 mg. Recent LDL level was 114 mg/dL. Will change to rosuvastatin (Crestor) for better lipid control and fewer potential side effects. Explained that rosuvastatin is a cleaner statin with better processing by the body and is expected to provide better results with fewer side effects. Primary care physician to manage the prescription.   - Switch simvastatin 20 mg to rosuvastatin 20 mg   - Coordinate with primary care physician for ongoing prescription management    Peripheral Edema    Reports leg swelling during the day, likely due to prolonged sitting. Managed with furosemide and compression stockings. Encouraged continued use of compression stockings despite difficulty in putting them on and off.   - Continue furosemide   - Encourage use of compression stockings    General Health Maintenance   Non-smoker, non-drinker. Family history of heart disease. Blood pressure slightly elevated today, likely due to stress. No history of hypertension. Advised to monitor blood pressure at home and continue current lifestyle modifications.   - Monitor blood pressure at home (BP is usually normal) - Continue current lifestyle modifications    Follow-up   - Coordinate scheduling of Lexiscan stress test and echocardiogram   - Follow up with primary care physician for ongoing management of hyperlipidemia and other health concerns.           Informed Consent   Shared Decision Making/Informed Consent The risks [chest pain, shortness of breath, cardiac arrhythmias, dizziness, blood pressure fluctuations, myocardial infarction, stroke/transient ischemic attack, nausea, vomiting, allergic reaction, radiation exposure, metallic taste sensation and life-threatening complications (estimated to be 1 in 10,000)], benefits (risk stratification, diagnosing coronary artery disease, treatment guidance) and alternatives of a nuclear stress test were discussed in detail with Ms. Perdue and she agrees to proceed.       Signed, Donato Schultz, MD

## 2023-11-10 DIAGNOSIS — Z22322 Carrier or suspected carrier of Methicillin resistant Staphylococcus aureus: Secondary | ICD-10-CM | POA: Diagnosis not present

## 2023-11-25 ENCOUNTER — Encounter (HOSPITAL_COMMUNITY): Payer: Self-pay

## 2023-11-27 DIAGNOSIS — Z1211 Encounter for screening for malignant neoplasm of colon: Secondary | ICD-10-CM | POA: Diagnosis not present

## 2023-12-03 ENCOUNTER — Ambulatory Visit (HOSPITAL_COMMUNITY): Payer: BC Managed Care – PPO | Attending: Internal Medicine

## 2023-12-03 ENCOUNTER — Other Ambulatory Visit (HOSPITAL_COMMUNITY): Payer: BC Managed Care – PPO

## 2023-12-03 ENCOUNTER — Ambulatory Visit (HOSPITAL_BASED_OUTPATIENT_CLINIC_OR_DEPARTMENT_OTHER): Payer: BC Managed Care – PPO

## 2023-12-03 DIAGNOSIS — R011 Cardiac murmur, unspecified: Secondary | ICD-10-CM | POA: Insufficient documentation

## 2023-12-03 DIAGNOSIS — Z01818 Encounter for other preprocedural examination: Secondary | ICD-10-CM | POA: Insufficient documentation

## 2023-12-03 DIAGNOSIS — Z01810 Encounter for preprocedural cardiovascular examination: Secondary | ICD-10-CM | POA: Diagnosis not present

## 2023-12-03 DIAGNOSIS — I447 Left bundle-branch block, unspecified: Secondary | ICD-10-CM | POA: Diagnosis not present

## 2023-12-03 LAB — MYOCARDIAL PERFUSION IMAGING
LV dias vol: 64 mL (ref 46–106)
LV sys vol: 21 mL
Nuc Stress EF: 66 %
Peak HR: 81 {beats}/min
Rest HR: 80 {beats}/min
Rest Nuclear Isotope Dose: 10.2 mCi
SDS: 1
SRS: 3
SSS: 4
ST Depression (mm): 0 mm
Stress Nuclear Isotope Dose: 32.4 mCi
TID: 0.99

## 2023-12-03 LAB — ECHOCARDIOGRAM COMPLETE
Area-P 1/2: 3.77 cm2
Height: 64 in
S' Lateral: 3.2 cm
Weight: 2624 [oz_av]

## 2023-12-03 MED ORDER — TECHNETIUM TC 99M TETROFOSMIN IV KIT
10.2000 | PACK | Freq: Once | INTRAVENOUS | Status: AC | PRN
  Administered 2023-12-03: 10.2 via INTRAVENOUS

## 2023-12-03 MED ORDER — REGADENOSON 0.4 MG/5ML IV SOLN
0.4000 mg | Freq: Once | INTRAVENOUS | Status: AC
  Administered 2023-12-03: 0.4 mg via INTRAVENOUS

## 2023-12-03 MED ORDER — TECHNETIUM TC 99M TETROFOSMIN IV KIT
32.4000 | PACK | Freq: Once | INTRAVENOUS | Status: AC | PRN
  Administered 2023-12-03: 32.4 via INTRAVENOUS

## 2023-12-04 LAB — COLOGUARD: COLOGUARD: POSITIVE — AB

## 2023-12-04 LAB — EXTERNAL GENERIC LAB PROCEDURE: COLOGUARD: POSITIVE — AB

## 2023-12-05 ENCOUNTER — Encounter: Payer: Self-pay | Admitting: Cardiology

## 2023-12-10 ENCOUNTER — Encounter (HOSPITAL_COMMUNITY): Payer: Self-pay

## 2023-12-10 ENCOUNTER — Other Ambulatory Visit: Payer: Self-pay | Admitting: Neurological Surgery

## 2023-12-10 NOTE — Progress Notes (Signed)
Surgical Instructions   Your procedure is scheduled on Monday December 15, 2023. Report to [facility] Main Entrance "A" at 2:00 P.M. , then check in with the Admitting office. Any questions or running late day of surgery: call [phone]  Questions prior to your surgery date: call [phone], Monday-Friday, 8am-4pm. If you experience any cold or flu symptoms such as cough, fever, chills, shortness of breath, etc. between now and your scheduled surgery, please notify us at the above number.     Remember:  Do not eat after midnight the night before your surgery  You may drink clear liquids until 1:00 P.M the day of your surgery.   Clear liquids allowed are: Water, Non-Citrus Juices (without pulp), Carbonated Beverages, Clear Tea (no milk, honey, etc.), Black Coffee Only (NO MILK, CREAM OR POWDERED CREAMER of any kind), and Gatorade.    Take these medicines the morning of surgery with A SIP OF WATER  rosuvastatin (CRESTOR)   May take these medicines IF NEEDED: cyclobenzaprine (FLEXERIL)  rizatriptan (MAXALT)  traMADol (ULTRAM)   One week prior to surgery, STOP taking any Aspirin (unless otherwise instructed by your surgeon) Aleve, Naproxen, Ibuprofen, Motrin, Advil, Goody's, BC's, all herbal medications, fish oil, and non-prescription vitamins.                     Do NOT Smoke (Tobacco/Vaping) for 24 hours prior to your procedure.  If you use a CPAP at night, you may bring your mask/headgear for your overnight stay.   You will be asked to remove any contacts, glasses, piercing's, hearing aid's, dentures/partials prior to surgery. Please bring cases for these items if needed.    Patients discharged the day of surgery will not be allowed to drive home, and someone needs to stay with them for 24 hours.  SURGICAL WAITING ROOM VISITATION Patients may have no more than 2 support people in the waiting area - these visitors may rotate.   Pre-op nurse will coordinate an appropriate time  for 1 ADULT support person, who may not rotate, to accompany patient in pre-op.  Children under the age of 79 must have an adult with them who is not the patient and must remain in the main waiting area with an adult.  If the patient needs to stay at the hospital during part of their recovery, the visitor guidelines for inpatient rooms apply.  Please refer to the [facility] website for the visitor guidelines for any additional information.   If you received a COVID test during your pre-op visit  it is requested that you wear a mask when out in public, stay away from anyone that may not be feeling well and notify your surgeon if you develop symptoms. If you have been in contact with anyone that has tested positive in the last 10 days please notify you surgeon.      Pre-operative 5 CHG Bathing Instructions   You can play a key role in reducing the risk of infection after surgery. Your skin needs to be as free of germs as possible. You can reduce the number of germs on your skin by washing with CHG (chlorhexidine gluconate) soap before surgery. CHG is an antiseptic soap that kills germs and continues to kill germs even after washing.   DO NOT use if you have an allergy to chlorhexidine/CHG or antibacterial soaps. If your skin becomes reddened or irritated, stop using the CHG and notify one of our RNs at [phone].   Please shower with  the CHG soap starting 4 days before surgery using the following schedule:     Please keep in mind the following:  DO NOT shave, including legs and underarms, starting the day of your first shower.   You may shave your face at any point before/day of surgery.  Place clean sheets on your bed the day you start using CHG soap. Use a clean washcloth (not used since being washed) for each shower. DO NOT sleep with pets once you start using the CHG.   CHG Shower Instructions:  Wash your face and private area with normal soap. If you choose to wash your hair,  wash first with your normal shampoo.  After you use shampoo/soap, rinse your hair and body thoroughly to remove shampoo/soap residue.  Turn the water OFF and apply about 3 tablespoons (45 ml) of CHG soap to a CLEAN washcloth.  Apply CHG soap ONLY FROM YOUR NECK DOWN TO YOUR TOES (washing for 3-5 minutes)  DO NOT use CHG soap on face, private areas, open wounds, or sores.  Pay special attention to the area where your surgery is being performed.  If you are having back surgery, having someone wash your back for you may be helpful. Wait 2 minutes after CHG soap is applied, then you may rinse off the CHG soap.  Pat dry with a clean towel  Put on clean clothes/pajamas   If you choose to wear lotion, please use ONLY the CHG-compatible lotions that are listed below.  Additional instructions for the day of surgery: DO NOT APPLY any lotions, deodorants or perfumes.   Do not bring valuables to the hospital. [facility] is not responsible for any belongings/valuables. Do not wear nail polish, gel polish, artificial nails, or any other type of covering on natural nails (fingers and toes) Do not wear jewelry or makeup Put on clean/comfortable clothes.  Please brush your teeth.  Ask your nurse before applying any prescription medications to the skin.     CHG Compatible Lotions   Aveeno Moisturizing lotion  Cetaphil Moisturizing Cream  Cetaphil Moisturizing Lotion  Clairol Herbal Essence Moisturizing Lotion, Dry Skin  Clairol Herbal Essence Moisturizing Lotion, Extra Dry Skin  Clairol Herbal Essence Moisturizing Lotion, Normal Skin  Curel Age Defying Therapeutic Moisturizing Lotion with Alpha Hydroxy  Curel Extreme Care Body Lotion  Curel Soothing Hands Moisturizing Hand Lotion  Curel Therapeutic Moisturizing Cream, Fragrance-Free  Curel Therapeutic Moisturizing Lotion, Fragrance-Free  Curel Therapeutic Moisturizing Lotion, Original Formula  Eucerin Daily Replenishing Lotion  Eucerin Dry Skin  Therapy Plus Alpha Hydroxy Crme  Eucerin Dry Skin Therapy Plus Alpha Hydroxy Lotion  Eucerin Original Crme  Eucerin Original Lotion  Eucerin Plus Crme Eucerin Plus Lotion  Eucerin TriLipid Replenishing Lotion  Keri Anti-Bacterial Hand Lotion  Keri Deep Conditioning Original Lotion Dry Skin Formula Softly Scented  Keri Deep Conditioning Original Lotion, Fragrance Free Sensitive Skin Formula  Keri Lotion Fast Absorbing Fragrance Free Sensitive Skin Formula  Keri Lotion Fast Absorbing Softly Scented Dry Skin Formula  Keri Original Lotion  Keri Skin Renewal Lotion Keri Silky Smooth Lotion  Keri Silky Smooth Sensitive Skin Lotion  Nivea Body Creamy Conditioning Oil  Nivea Body Extra Enriched Teacher, adult education Moisturizing Lotion Nivea Crme  Nivea Skin Firming Lotion  NutraDerm 30 Skin Lotion  NutraDerm Skin Lotion  NutraDerm Therapeutic Skin Cream  NutraDerm Therapeutic Skin Lotion  ProShield Protective Hand Cream  Provon moisturizing lotion  Please read over the following fact  sheets that you were given.

## 2023-12-11 ENCOUNTER — Encounter (HOSPITAL_COMMUNITY): Payer: Self-pay

## 2023-12-11 ENCOUNTER — Encounter (HOSPITAL_COMMUNITY)
Admission: RE | Admit: 2023-12-11 | Discharge: 2023-12-11 | Disposition: A | Discharge status: Home / Self Care | Payer: BC Managed Care – PPO | Source: Ambulatory Visit | Attending: Neurological Surgery | Admitting: Neurological Surgery

## 2023-12-11 ENCOUNTER — Other Ambulatory Visit: Payer: Self-pay

## 2023-12-11 VITALS — BP 134/72 | HR 76 | Temp 98.2°F | Resp 17 | Ht 64.0 in | Wt 166.8 lb

## 2023-12-11 DIAGNOSIS — I447 Left bundle-branch block, unspecified: Secondary | ICD-10-CM | POA: Insufficient documentation

## 2023-12-11 DIAGNOSIS — Z01812 Encounter for preprocedural laboratory examination: Secondary | ICD-10-CM | POA: Diagnosis not present

## 2023-12-11 DIAGNOSIS — Z01818 Encounter for other preprocedural examination: Secondary | ICD-10-CM

## 2023-12-11 DIAGNOSIS — E785 Hyperlipidemia, unspecified: Secondary | ICD-10-CM | POA: Diagnosis not present

## 2023-12-11 DIAGNOSIS — R609 Edema, unspecified: Secondary | ICD-10-CM | POA: Diagnosis not present

## 2023-12-11 HISTORY — DX: Cardiac arrhythmia, unspecified: I49.9

## 2023-12-11 HISTORY — DX: Localized edema: R60.0

## 2023-12-11 LAB — CBC
HCT: 41.8 % (ref 36.0–46.0)
Hemoglobin: 13.2 g/dL (ref 12.0–15.0)
MCH: 28.1 pg (ref 26.0–34.0)
MCHC: 31.6 g/dL (ref 30.0–36.0)
MCV: 89.1 fL (ref 80.0–100.0)
Platelets: 233 10*3/uL (ref 150–400)
RBC: 4.69 MIL/uL (ref 3.87–5.11)
RDW: 14.3 % (ref 11.5–15.5)
WBC: 6.9 10*3/uL (ref 4.0–10.5)
nRBC: 0 % (ref 0.0–0.2)

## 2023-12-11 LAB — BASIC METABOLIC PANEL
Anion gap: 9 (ref 5–15)
BUN: 15 mg/dL (ref 8–23)
CO2: 28 mmol/L (ref 22–32)
Calcium: 10 mg/dL (ref 8.9–10.3)
Chloride: 106 mmol/L (ref 98–111)
Creatinine, Ser: 0.77 mg/dL (ref 0.44–1.00)
GFR, Estimated: >60 mL/min (ref >60)
Glucose, Bld: 110 mg/dL — ABNORMAL HIGH (ref 70–99)
Potassium: 4.9 mmol/L (ref 3.5–5.1)
Sodium: 143 mmol/L (ref 135–145)

## 2023-12-11 LAB — SURGICAL PCR SCREEN
MRSA, PCR: NEGATIVE
Staphylococcus aureus: NEGATIVE

## 2023-12-11 NOTE — Progress Notes (Signed)
PCP - Dr. Lonie Peak Cardiologist - Dr. Donato Schultz   PPM/ICD - Denies Device Orders - n/a Rep Notified - n/a  Chest x-ray - n/a EKG - 10-24-23 Stress Test - 12-03-23 ECHO - 12-03-23 Cardiac Cath - Denies  Sleep Study - Denies CPAP - n/a  NON-diabeteic  Last dose of GLP1 agonist-  Denies GLP1 instructions: n/a  Blood Thinner Instructions: Denies Aspirin Instructions: Denies  ERAS Protcol - ERAS till 1300 PRE-SURGERY Ensure or G2- none  COVID TEST- No   Anesthesia review: Yes, cardiac clearance 10-31-23, LBBB,   Patient denies shortness of breath, fever, cough and chest pain at PAT appointment. Patient denies any respiratory issues at this time.    All instructions explained to the patient, with a verbal understanding of the material. Patient agrees to go over the instructions while at home for a better understanding. Patient also instructed to self quarantine after being tested for COVID-19. The opportunity to ask questions was provided.

## 2023-12-12 NOTE — Progress Notes (Signed)
Anesthesia Chart Review:  63 year old female pertinent history including left bundle branch block, HLD, edema.  She was seen by cardiologist Dr. Anne Fu on 10/31/2023 for preop risk stratification.  Echo and stress test were ordered.  Echo showed EF 55 to 60%, grade 1 DD.  Stress test was low risk, no reversible ischemia.  Based on these results, she was cleared to proceed with lumbar surgery.  Preop labs reviewed, WNL.  EKG 10/24/2023: NSR.  Rate 71.  Left bundle branch block.  Nuclear stress 12/03/2023:   Findings are consistent with no ischemia. The study is low risk.   No ST deviation was noted.   LV perfusion is abnormal. Defect 1: There is a small defect with mild reduction in uptake present in the mid anteroseptal location(s) that is fixed. There is normal wall motion in the defect area. Consistent with artifact caused by left bundle branch block.   Left ventricular function is normal. Nuclear stress EF: 66%. The left ventricular ejection fraction is hyperdynamic (>65%). End diastolic cavity size is normal. End systolic cavity size is normal.   Prior study not available for comparison.   Small size, mild intensity, mostly fixed (SDS 1) mid-anteroseptal perfusion defect, likely LBBB-related artifact. No reversible ischemia. LVEF 66% with normal wall motion. This is a low risk study. No prior for comparison.  TTE 12/03/2023:  1. Left ventricular ejection fraction, by estimation, is 55 to 60%. The  left ventricle has normal function. The left ventricle has no regional  wall motion abnormalities. Left ventricular diastolic parameters are  consistent with Grade I diastolic  dysfunction (impaired relaxation). The average left ventricular global  longitudinal strain is -18.0 %. The global longitudinal strain is normal.   2. Right ventricular systolic function is normal. The right ventricular  size is normal.   3. The mitral valve is normal in structure. Trivial mitral valve  regurgitation.    4. The aortic valve is tricuspid. There is mild calcification of the  aortic valve. Aortic valve regurgitation is not visualized.   5. The inferior vena cava is normal in size with greater than 50%  respiratory variability, suggesting right atrial pressure of 3 mmHg.      Zannie Cove [facility]/Anesthesiology Phone [phone] 12/12/2023 9:51 AM

## 2023-12-12 NOTE — Anesthesia Preprocedure Evaluation (Signed)
Anesthesia Evaluation  Patient identified by MRN, date of birth, ID band Patient awake    Reviewed: Allergy & Precautions, NPO status , Patient's Chart, lab work & pertinent test results  History of Anesthesia Complications (+) AWARENESS UNDER ANESTHESIA and history of anesthetic complications  Airway Mallampati: II  TM Distance: >3 FB     Dental  (+) Dental Advisory Given, Teeth Intact, Caps   Pulmonary neg pulmonary ROS   Pulmonary exam normal breath sounds clear to auscultation       Cardiovascular negative cardio ROS Normal cardiovascular exam+ dysrhythmias  Rhythm:Regular Rate:Normal  EKG 10/24/23 NSR, LAD, LBBB pattern  Echo 12/03/23 1. Left ventricular ejection fraction, by estimation, is 55 to 60%. The  left ventricle has normal function. The left ventricle has no regional  wall motion abnormalities. Left ventricular diastolic parameters are  consistent with Grade I diastolic  dysfunction (impaired relaxation). The average left ventricular global  longitudinal strain is -18.0 %. The global longitudinal strain is normal.   2. Right ventricular systolic function is normal. The right ventricular  size is normal.   3. The mitral valve is normal in structure. Trivial mitral valve  regurgitation.   4. The aortic valve is tricuspid. There is mild calcification of the  aortic valve. Aortic valve regurgitation is not visualized.   5. The inferior vena cava is normal in size with greater than 50%  respiratory variability, suggesting right atrial pressure of 3 mmHg.     Neuro/Psych  Headaches  Neuromuscular disease  negative psych ROS   GI/Hepatic negative GI ROS, Neg liver ROS,,,  Endo/Other  Hx/o left breast Ca S/P lumpectomy + RT Hyperlipidemia  Renal/GU Renal diseaseHx/o renal calculi  negative genitourinary   Musculoskeletal  (+) Arthritis , Osteoarthritis,  Lumbar radiculopathy Osteoporosis   Abdominal    Peds  Hematology negative hematology ROS (+)   Anesthesia Other Findings   Reproductive/Obstetrics                             Anesthesia Physical Anesthesia Plan  ASA: 2  Anesthesia Plan: General   Post-op Pain Management: Dilaudid IV, Precedex, Ofirmev IV (intra-op)* and Ketamine IV*   Induction: Intravenous  PONV Risk Score and Plan: 4 or greater and Treatment may vary due to age or medical condition, Ondansetron, Dexamethasone and Midazolam  Airway Management Planned: Oral ETT  Additional Equipment: None  Intra-op Plan:   Post-operative Plan: Extubation in OR  Informed Consent: I have reviewed the patients History and Physical, chart, labs and discussed the procedure including the risks, benefits and alternatives for the proposed anesthesia with the patient or authorized representative who has indicated his/her understanding and acceptance.     Dental advisory given  Plan Discussed with: CRNA and Anesthesiologist  Anesthesia Plan Comments: (Use BIS monitor for procedure   PAT note by Antionette Poles, PA-C: 63 year old female pertinent history including left bundle branch block, HLD, edema.  She was seen by cardiologist Dr. Anne Fu on 10/31/2023 for preop risk stratification.  Echo and stress test were ordered.  Echo showed EF 55 to 60%, grade 1 DD.  Stress test was low risk, no reversible ischemia.  Based on these results, she was cleared to proceed with lumbar surgery.   Preop labs reviewed, WNL.  EKG 10/24/2023: NSR.  Rate 71.  Left bundle branch block.  Nuclear stress 12/03/2023:   Findings are consistent with no ischemia. The study is low risk.   No  ST deviation was noted.   LV perfusion is abnormal. Defect 1: There is a small defect with mild reduction in uptake present in the mid anteroseptal location(s) that is fixed. There is normal wall motion in the defect area. Consistent with artifact caused by left bundle branch block.    Left ventricular function is normal. Nuclear stress EF: 66%. The left ventricular ejection fraction is hyperdynamic (>65%). End diastolic cavity size is normal. End systolic cavity size is normal.   Prior study not available for comparison.  Small size, mild intensity, mostly fixed (SDS 1) mid-anteroseptal perfusion defect, likely LBBB-related artifact. No reversible ischemia. LVEF 66% with normal wall motion. This is a low risk study. No prior for comparison.  TTE 12/03/2023: 1. Left ventricular ejection fraction, by estimation, is 55 to 60%. The  left ventricle has normal function. The left ventricle has no regional  wall motion abnormalities. Left ventricular diastolic parameters are  consistent with Grade I diastolic  dysfunction (impaired relaxation). The average left ventricular global  longitudinal strain is -18.0 %. The global longitudinal strain is normal.  2. Right ventricular systolic function is normal. The right ventricular  size is normal.  3. The mitral valve is normal in structure. Trivial mitral valve  regurgitation.  4. The aortic valve is tricuspid. There is mild calcification of the  aortic valve. Aortic valve regurgitation is not visualized.  5. The inferior vena cava is normal in size with greater than 50%  respiratory variability, suggesting right atrial pressure of 3 mmHg.    )        Anesthesia Quick Evaluation

## 2023-12-15 ENCOUNTER — Other Ambulatory Visit: Payer: Self-pay

## 2023-12-15 ENCOUNTER — Ambulatory Visit (HOSPITAL_COMMUNITY): Payer: Self-pay | Admitting: Physician Assistant

## 2023-12-15 ENCOUNTER — Ambulatory Visit (HOSPITAL_COMMUNITY): Payer: BC Managed Care – PPO | Admitting: Registered Nurse

## 2023-12-15 ENCOUNTER — Observation Stay (HOSPITAL_COMMUNITY)
Admission: RE | Admit: 2023-12-15 | Discharge: 2023-12-16 | Disposition: A | Discharge status: Home / Self Care | Payer: BC Managed Care – PPO | Attending: Neurological Surgery | Admitting: Neurological Surgery

## 2023-12-15 ENCOUNTER — Ambulatory Visit (HOSPITAL_COMMUNITY): Payer: BC Managed Care – PPO

## 2023-12-15 ENCOUNTER — Encounter (HOSPITAL_COMMUNITY)
Admission: RE | Disposition: A | Discharge status: Home / Self Care | Payer: Self-pay | Source: Home / Self Care | Attending: Neurological Surgery

## 2023-12-15 ENCOUNTER — Encounter (HOSPITAL_COMMUNITY): Payer: Self-pay | Admitting: Neurological Surgery

## 2023-12-15 DIAGNOSIS — M48061 Spinal stenosis, lumbar region without neurogenic claudication: Secondary | ICD-10-CM | POA: Diagnosis not present

## 2023-12-15 DIAGNOSIS — T84226A Displacement of internal fixation device of vertebrae, initial encounter: Secondary | ICD-10-CM | POA: Diagnosis not present

## 2023-12-15 DIAGNOSIS — Y828 Other medical devices associated with adverse incidents: Secondary | ICD-10-CM | POA: Insufficient documentation

## 2023-12-15 DIAGNOSIS — Z91040 Latex allergy status: Secondary | ICD-10-CM | POA: Diagnosis not present

## 2023-12-15 DIAGNOSIS — Z981 Arthrodesis status: Secondary | ICD-10-CM | POA: Diagnosis not present

## 2023-12-15 DIAGNOSIS — Z79899 Other long term (current) drug therapy: Secondary | ICD-10-CM | POA: Insufficient documentation

## 2023-12-15 DIAGNOSIS — M5416 Radiculopathy, lumbar region: Secondary | ICD-10-CM | POA: Diagnosis not present

## 2023-12-15 DIAGNOSIS — M4726 Other spondylosis with radiculopathy, lumbar region: Principal | ICD-10-CM | POA: Insufficient documentation

## 2023-12-15 HISTORY — PX: DECOMPRESSIVE LUMBAR LAMINECTOMY LEVEL 1: SHX5791

## 2023-12-15 SURGERY — DECOMPRESSIVE LUMBAR LAMINECTOMY LEVEL 1
Anesthesia: General | Laterality: Left

## 2023-12-15 MED ORDER — FUROSEMIDE 40 MG PO TABS
40.0000 mg | ORAL_TABLET | Freq: Every morning | ORAL | Status: DC
  Filled 2023-12-15: qty 1

## 2023-12-15 MED ORDER — METHOCARBAMOL 1000 MG/10ML IJ SOLN: 500.0000 mg | Freq: Four times a day (QID) | INTRAMUSCULAR | Status: DC | PRN

## 2023-12-15 MED ORDER — METHOCARBAMOL 500 MG PO TABS
500.0000 mg | ORAL_TABLET | Freq: Four times a day (QID) | ORAL | Status: DC | PRN
  Administered 2023-12-15: 500 mg via ORAL

## 2023-12-15 MED ORDER — ONDANSETRON HCL 4 MG PO TABS
4.0000 mg | ORAL_TABLET | Freq: Four times a day (QID) | ORAL | Status: DC | PRN
Start: 2023-12-15 — End: 2023-12-16

## 2023-12-15 MED ORDER — VANCOMYCIN HCL IN DEXTROSE 1-5 GM/200ML-% IV SOLN
1000.0000 mg | INTRAVENOUS | Status: AC
  Administered 2023-12-15: 1000 mg via INTRAVENOUS
  Filled 2023-12-15: qty 200

## 2023-12-15 MED ORDER — BUPIVACAINE HCL (PF) 0.5 % IJ SOLN
INTRAMUSCULAR | Status: AC
  Filled 2023-12-15: qty 30

## 2023-12-15 MED ORDER — FENTANYL CITRATE (PF) 250 MCG/5ML IJ SOLN
INTRAMUSCULAR | Status: AC
  Filled 2023-12-15: qty 5

## 2023-12-15 MED ORDER — 0.9 % SODIUM CHLORIDE (POUR BTL) OPTIME
TOPICAL | Status: DC | PRN
  Administered 2023-12-15: 1000 mL

## 2023-12-15 MED ORDER — FLEET ENEMA RE ENEM: 1.0000 | ENEMA | Freq: Once | RECTAL | Status: DC | PRN

## 2023-12-15 MED ORDER — METHOCARBAMOL 500 MG PO TABS
ORAL_TABLET | ORAL | Status: AC
  Filled 2023-12-15: qty 1

## 2023-12-15 MED ORDER — LACTATED RINGERS IV SOLN: INTRAVENOUS | Status: DC | PRN

## 2023-12-15 MED ORDER — ONDANSETRON HCL 4 MG/2ML IJ SOLN
INTRAMUSCULAR | Status: DC | PRN
  Administered 2023-12-15: 4 mg via INTRAVENOUS

## 2023-12-15 MED ORDER — SUGAMMADEX SODIUM 200 MG/2ML IV SOLN
INTRAVENOUS | Status: DC | PRN
  Administered 2023-12-15: 200 mg via INTRAVENOUS

## 2023-12-15 MED ORDER — ROCURONIUM BROMIDE 10 MG/ML (PF) SYRINGE
PREFILLED_SYRINGE | INTRAVENOUS | Status: DC | PRN
  Administered 2023-12-15: 70 mg via INTRAVENOUS

## 2023-12-15 MED ORDER — TRAMADOL HCL 50 MG PO TABS
50.0000 mg | ORAL_TABLET | Freq: Four times a day (QID) | ORAL | Status: DC | PRN
  Administered 2023-12-15: 50 mg via ORAL
  Filled 2023-12-15 (×2): qty 1

## 2023-12-15 MED ORDER — DEXAMETHASONE SODIUM PHOSPHATE 10 MG/ML IJ SOLN
INTRAMUSCULAR | Status: DC | PRN
  Administered 2023-12-15: 10 mg via INTRAVENOUS

## 2023-12-15 MED ORDER — THROMBIN 5000 UNITS EX SOLR
OROMUCOSAL | Status: DC | PRN
  Administered 2023-12-15: 5 mL via TOPICAL

## 2023-12-15 MED ORDER — CHLORHEXIDINE GLUCONATE CLOTH 2 % EX PADS: 6.0000 | MEDICATED_PAD | Freq: Once | CUTANEOUS | Status: DC

## 2023-12-15 MED ORDER — POLYETHYLENE GLYCOL 3350 17 G PO PACK: 17.0000 g | PACK | Freq: Every day | ORAL | Status: DC | PRN

## 2023-12-15 MED ORDER — MIDAZOLAM HCL 2 MG/2ML IJ SOLN
INTRAMUSCULAR | Status: DC | PRN
  Administered 2023-12-15: 2 mg via INTRAVENOUS

## 2023-12-15 MED ORDER — SUMATRIPTAN SUCCINATE 100 MG PO TABS: 100.0000 mg | ORAL_TABLET | ORAL | Status: DC | PRN

## 2023-12-15 MED ORDER — SODIUM CHLORIDE 0.9 % IV SOLN
250.0000 mL | INTRAVENOUS | Status: DC
  Administered 2023-12-15: 250 mL via INTRAVENOUS

## 2023-12-15 MED ORDER — ACETAMINOPHEN 650 MG RE SUPP: 650.0000 mg | RECTAL | Status: DC | PRN

## 2023-12-15 MED ORDER — SODIUM CHLORIDE 0.9% FLUSH
3.0000 mL | Freq: Two times a day (BID) | INTRAVENOUS | Status: DC
  Administered 2023-12-15: 3 mL via INTRAVENOUS

## 2023-12-15 MED ORDER — ORAL CARE MOUTH RINSE: 15.0000 mL | Freq: Once | OROMUCOSAL | Status: AC

## 2023-12-15 MED ORDER — AMISULPRIDE (ANTIEMETIC) 5 MG/2ML IV SOLN: 10.0000 mg | Freq: Once | INTRAVENOUS | Status: DC | PRN

## 2023-12-15 MED ORDER — ACETAMINOPHEN 325 MG PO TABS: 650.0000 mg | ORAL_TABLET | ORAL | Status: DC | PRN

## 2023-12-15 MED ORDER — HYDROMORPHONE HCL 1 MG/ML IJ SOLN
0.5000 mg | INTRAMUSCULAR | Status: DC | PRN
  Administered 2023-12-15: 0.5 mg via INTRAVENOUS
  Filled 2023-12-15: qty 0.5

## 2023-12-15 MED ORDER — DOCUSATE SODIUM 100 MG PO CAPS
100.0000 mg | ORAL_CAPSULE | Freq: Two times a day (BID) | ORAL | Status: DC
  Administered 2023-12-15 – 2023-12-16 (×2): 100 mg via ORAL
  Filled 2023-12-15 (×2): qty 1

## 2023-12-15 MED ORDER — PHENYLEPHRINE HCL-NACL 20-0.9 MG/250ML-% IV SOLN
INTRAVENOUS | Status: DC | PRN
  Administered 2023-12-15: 40 ug/min via INTRAVENOUS

## 2023-12-15 MED ORDER — LIDOCAINE 2% (20 MG/ML) 5 ML SYRINGE
INTRAMUSCULAR | Status: DC | PRN
  Administered 2023-12-15: 60 mg via INTRAVENOUS

## 2023-12-15 MED ORDER — HYDROMORPHONE HCL 1 MG/ML IJ SOLN: 0.2500 mg | INTRAMUSCULAR | Status: DC | PRN

## 2023-12-15 MED ORDER — LIDOCAINE-EPINEPHRINE 1 %-1:100000 IJ SOLN
INTRAMUSCULAR | Status: DC | PRN
  Administered 2023-12-15: 5 mL

## 2023-12-15 MED ORDER — OXYCODONE HCL 5 MG PO TABS
5.0000 mg | ORAL_TABLET | Freq: Once | ORAL | Status: AC | PRN
  Administered 2023-12-15: 5 mg via ORAL

## 2023-12-15 MED ORDER — BISACODYL 10 MG RE SUPP: 10.0000 mg | Freq: Every day | RECTAL | Status: DC | PRN

## 2023-12-15 MED ORDER — PROPOFOL 10 MG/ML IV BOLUS
INTRAVENOUS | Status: AC
  Filled 2023-12-15: qty 20

## 2023-12-15 MED ORDER — ACETAMINOPHEN 10 MG/ML IV SOLN
INTRAVENOUS | Status: DC | PRN
  Administered 2023-12-15: 1000 mg via INTRAVENOUS

## 2023-12-15 MED ORDER — PHENOL 1.4 % MT LIQD: 1.0000 | OROMUCOSAL | Status: DC | PRN

## 2023-12-15 MED ORDER — ONDANSETRON HCL 4 MG/2ML IJ SOLN: 4.0000 mg | Freq: Once | INTRAMUSCULAR | Status: DC | PRN

## 2023-12-15 MED ORDER — CYCLOBENZAPRINE HCL 10 MG PO TABS
10.0000 mg | ORAL_TABLET | Freq: Three times a day (TID) | ORAL | Status: DC | PRN
  Administered 2023-12-16: 10 mg via ORAL
  Filled 2023-12-15 (×2): qty 1

## 2023-12-15 MED ORDER — THROMBIN 5000 UNITS EX SOLR
CUTANEOUS | Status: AC
  Filled 2023-12-15: qty 5000

## 2023-12-15 MED ORDER — OXYCODONE HCL 5 MG PO TABS
ORAL_TABLET | ORAL | Status: AC
  Filled 2023-12-15: qty 1

## 2023-12-15 MED ORDER — ROSUVASTATIN CALCIUM 20 MG PO TABS
20.0000 mg | ORAL_TABLET | Freq: Every day | ORAL | Status: DC
Start: 2023-12-16 — End: 2023-12-16
  Filled 2023-12-15: qty 1

## 2023-12-15 MED ORDER — PROPOFOL 10 MG/ML IV BOLUS
INTRAVENOUS | Status: DC | PRN
  Administered 2023-12-15: 150 mg via INTRAVENOUS

## 2023-12-15 MED ORDER — ZOLPIDEM TARTRATE 5 MG PO TABS: 2.5000 mg | ORAL_TABLET | Freq: Every evening | ORAL | Status: DC | PRN

## 2023-12-15 MED ORDER — MENTHOL 3 MG MT LOZG: 1.0000 | LOZENGE | OROMUCOSAL | Status: DC | PRN

## 2023-12-15 MED ORDER — FENTANYL CITRATE (PF) 250 MCG/5ML IJ SOLN
INTRAMUSCULAR | Status: DC | PRN
  Administered 2023-12-15 (×2): 50 ug via INTRAVENOUS
  Administered 2023-12-15: 100 ug via INTRAVENOUS
  Administered 2023-12-15: 50 ug via INTRAVENOUS

## 2023-12-15 MED ORDER — SENNA 8.6 MG PO TABS
1.0000 | ORAL_TABLET | Freq: Two times a day (BID) | ORAL | Status: DC
  Administered 2023-12-15 – 2023-12-16 (×2): 8.6 mg via ORAL
  Filled 2023-12-15 (×2): qty 1

## 2023-12-15 MED ORDER — OXYCODONE HCL 5 MG/5ML PO SOLN: 5.0000 mg | Freq: Once | ORAL | Status: AC | PRN

## 2023-12-15 MED ORDER — BUPIVACAINE HCL (PF) 0.5 % IJ SOLN
INTRAMUSCULAR | Status: DC | PRN
  Administered 2023-12-15: 5 mL
  Administered 2023-12-15: 20 mL

## 2023-12-15 MED ORDER — SODIUM CHLORIDE 0.9% FLUSH: 3.0000 mL | INTRAVENOUS | Status: DC | PRN

## 2023-12-15 MED ORDER — LIDOCAINE-EPINEPHRINE 1 %-1:100000 IJ SOLN
INTRAMUSCULAR | Status: AC
  Filled 2023-12-15: qty 1

## 2023-12-15 MED ORDER — MIDAZOLAM HCL 2 MG/2ML IJ SOLN
INTRAMUSCULAR | Status: AC
Start: 2023-12-15 — End: ?
  Filled 2023-12-15: qty 2

## 2023-12-15 MED ORDER — ALUM & MAG HYDROXIDE-SIMETH 200-200-20 MG/5ML PO SUSP: 30.0000 mL | Freq: Four times a day (QID) | ORAL | Status: DC | PRN

## 2023-12-15 MED ORDER — CHLORHEXIDINE GLUCONATE 0.12 % MT SOLN
15.0000 mL | Freq: Once | OROMUCOSAL | Status: AC
  Administered 2023-12-15: 15 mL via OROMUCOSAL
  Filled 2023-12-15: qty 15

## 2023-12-15 MED ORDER — ACETAMINOPHEN 10 MG/ML IV SOLN
INTRAVENOUS | Status: AC
  Filled 2023-12-15: qty 100

## 2023-12-15 MED ORDER — ONDANSETRON HCL 4 MG/2ML IJ SOLN: 4.0000 mg | Freq: Four times a day (QID) | INTRAMUSCULAR | Status: DC | PRN

## 2023-12-15 MED ORDER — CEFAZOLIN SODIUM-DEXTROSE 2-4 GM/100ML-% IV SOLN
2.0000 g | Freq: Three times a day (TID) | INTRAVENOUS | Status: AC
  Administered 2023-12-15 – 2023-12-16 (×2): 2 g via INTRAVENOUS
  Filled 2023-12-15 (×2): qty 100

## 2023-12-15 SURGICAL SUPPLY — 44 items
BAG COUNTER SPONGE SURGICOUNT (BAG) ×2 IMPLANT
BAND RUBBER #18 3X1/16 STRL (MISCELLANEOUS) ×4 IMPLANT
BLADE CLIPPER SURG (BLADE) IMPLANT
BUR ACORN 6.0 (BURR) IMPLANT
BUR MATCHSTICK NEURO 3.0 LAGG (BURR) ×2 IMPLANT
CANISTER SUCT 3000ML PPV (MISCELLANEOUS) ×2 IMPLANT
DERMABOND ADVANCED .7 DNX12 (GAUZE/BANDAGES/DRESSINGS) ×2 IMPLANT
DEVICE DISSECT PLASMABLAD 3.0S (MISCELLANEOUS) ×2 IMPLANT
DRAPE HALF SHEET 40X57 (DRAPES) IMPLANT
DRAPE LAPAROTOMY T 102X78X121 (DRAPES) ×2 IMPLANT
DRAPE MICROSCOPE SLANT 54X150 (MISCELLANEOUS) IMPLANT
DRSG OPSITE POSTOP 4X6 (GAUZE/BANDAGES/DRESSINGS) IMPLANT
DURAPREP 26ML APPLICATOR (WOUND CARE) ×2 IMPLANT
ELECT REM PT RETURN 9FT ADLT (ELECTROSURGICAL) ×1
ELECTRODE REM PT RTRN 9FT ADLT (ELECTROSURGICAL) ×2 IMPLANT
GAUZE 4X4 16PLY ~~LOC~~+RFID DBL (SPONGE) IMPLANT
GAUZE SPONGE 4X4 12PLY STRL (GAUZE/BANDAGES/DRESSINGS) ×2 IMPLANT
GLOVE BIOGEL PI IND STRL 8.5 (GLOVE) ×2 IMPLANT
GLOVE ECLIPSE 8.5 STRL (GLOVE) ×2 IMPLANT
GOWN STRL REUS W/ TWL LRG LVL3 (GOWN DISPOSABLE) IMPLANT
GOWN STRL REUS W/ TWL XL LVL3 (GOWN DISPOSABLE) IMPLANT
GOWN STRL REUS W/TWL 2XL LVL3 (GOWN DISPOSABLE) ×2 IMPLANT
HEMOSTAT POWDER KIT SURGIFOAM (HEMOSTASIS) ×2 IMPLANT
KIT BASIN OR (CUSTOM PROCEDURE TRAY) ×2 IMPLANT
KIT TURNOVER KIT B (KITS) ×2 IMPLANT
NDL HYPO 22X1.5 SAFETY MO (MISCELLANEOUS) ×2 IMPLANT
NDL SPNL 20GX3.5 QUINCKE YW (NEEDLE) IMPLANT
NEEDLE HYPO 22X1.5 SAFETY MO (MISCELLANEOUS) ×1
NEEDLE SPNL 20GX3.5 QUINCKE YW (NEEDLE) ×1
NS IRRIG 1000ML POUR BTL (IV SOLUTION) ×2 IMPLANT
PACK LAMINECTOMY NEURO (CUSTOM PROCEDURE TRAY) ×2 IMPLANT
PAD ARMBOARD 7.5X6 YLW CONV (MISCELLANEOUS) ×6 IMPLANT
PATTIES SURGICAL .5 X1 (DISPOSABLE) ×2 IMPLANT
PLASMABLADE 3.0S (MISCELLANEOUS) ×1
SPIKE FLUID TRANSFER (MISCELLANEOUS) ×2 IMPLANT
SPONGE SURGIFOAM ABS GEL SZ50 (HEMOSTASIS) IMPLANT
SUT VIC AB 1 CT1 18XBRD ANBCTR (SUTURE) ×2 IMPLANT
SUT VIC AB 2-0 CP2 18 (SUTURE) ×2 IMPLANT
SUT VIC AB 3-0 SH 8-18 (SUTURE) ×2 IMPLANT
SUT VIC AB 4-0 RB1 18 (SUTURE) ×2 IMPLANT
TOWEL GREEN STERILE (TOWEL DISPOSABLE) ×2 IMPLANT
TOWEL GREEN STERILE FF (TOWEL DISPOSABLE) ×2 IMPLANT
TUBING FEATHERFLOW (TUBING) ×2 IMPLANT
WATER STERILE IRR 1000ML POUR (IV SOLUTION) ×2 IMPLANT

## 2023-12-15 NOTE — Plan of Care (Signed)

## 2023-12-15 NOTE — H&P (Signed)
[name] is an 63 y.o. female.   Chief Complaint: Left lumbar radicular pain. HPI: [name] is a 63 year old individual whose had a significant surgical decompression and fusion using an XLIF technique at L2 [phone].  She did remarkably well but was having some residual left lumbar pain that is mostly in L3 distribution down the anterolateral aspect of the left thigh.  An adequate period of time has been allowed to let this process heal but because the pain persisted and MRI demonstrated presence of some lateral recess stenosis at the L3-4 level primarily affecting the path of the L4 nerve root.  After careful consideration of her options I discussed with her doing a little limited laminotomy and foraminotomies to decompress the path of the L4 nerve root is now to be performed.  Past Medical History:  Diagnosis Date   Breast mass, right    Chronic back pain    Dysrhythmia    LBBB   Headache    History of kidney stones    1990 & 2020   Hyperlipidemia    Peripheral edema     Past Surgical History:  Procedure Laterality Date   ABDOMINAL HYSTERECTOMY     BREAST BIOPSY Right 11/18/2005   BREAST EXCISIONAL BIOPSY     BREAST LUMPECTOMY WITH RADIOACTIVE SEED LOCALIZATION Right 09/09/2022   Procedure: RIGHT BREAST LUMPECTOMY WITH RADIOACTIVE SEED LOCALIZATION;  Surgeon: Griselda Miner, MD;  Location: [facility];  Service: General;  Laterality: Right;   CERVICAL FUSION     CHOLECYSTECTOMY     LUMBAR FUSION     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 She has never used smokeless tobacco. She reports that she does not currently use alcohol. She reports that she does not use drugs.  Allergies:  Allergies  Allergen Reactions   Codeine Nausea Only   Demerol  [Meperidine  Hcl] Nausea And Vomiting   Erythromycin Base Other (See Comments)    Turns patient jaundice    Augmentin [Amoxicillin-Pot Clavulanate] Nausea And Vomiting   Latex Rash   Tape Rash    Includes bandaids    Medications Prior to Admission  Medication Sig Dispense Refill   cyclobenzaprine (FLEXERIL) 10 MG tablet Take 10 mg by mouth 3 (three) times daily as needed for muscle spasms.     furosemide (LASIX) 40 MG tablet Take 40 mg by mouth in the morning. May take an additional dose if needed for edema     Phenylephrine-APAP-guaiFENesin (TYLENOL SINUS SEVERE) 5-325-200 MG TABS Take 1 tablet by mouth daily as needed (sinus pressure).     rizatriptan (MAXALT) 10 MG tablet Take 10 mg by mouth every 2 (two) hours as needed for migraine.     rosuvastatin (CRESTOR) 20 MG tablet Take 1 tablet (20 mg total) by mouth daily. 90 tablet 3   traMADol (ULTRAM) 50 MG tablet Take 50 mg by mouth every 6 (six) hours as needed (pain.).     zolpidem (AMBIEN) 5 MG tablet Take 2.5-5 mg by mouth at bedtime as needed for sleep.      No results found for this or any previous visit (from the past 48 hours). No results found.  Review  of Systems  Constitutional:  Positive for activity change.  Musculoskeletal:  Positive for back pain and gait problem.  Neurological:  Positive for weakness.  All other systems reviewed and are negative.   Blood pressure 132/69, pulse 88, temperature 97.7 F (36.5 C), temperature source Oral, resp. rate 18, height 5\' 4"  (1.626 m), weight 74.8 kg, SpO2 97%. Physical Exam Constitutional:      Appearance: Normal appearance. She is normal weight.  HENT:     Head: Normocephalic and atraumatic.     Right Ear: Tympanic membrane, ear canal and external ear normal.     Left Ear: Tympanic membrane, ear canal and external ear normal.     Nose: Nose normal.     Mouth/Throat:     Mouth: Mucous membranes are moist.     Pharynx: Oropharynx is clear.  Eyes:     Extraocular Movements:  Extraocular movements intact.     Conjunctiva/sclera: Conjunctivae normal.     Pupils: Pupils are equal, round, and reactive to light.  Cardiovascular:     Rate and Rhythm: Normal rate and regular rhythm.     Pulses: Normal pulses.     Heart sounds: Normal heart sounds.  Pulmonary:     Effort: Pulmonary effort is normal.     Breath sounds: Normal breath sounds.  Abdominal:     General: Abdomen is flat. Bowel sounds are normal.     Palpations: Abdomen is soft.  Musculoskeletal:     Cervical back: Normal range of motion and neck supple.     Comments: Positive straight leg raising on the left at 30 degrees Patrick's maneuver is negative laterally  Skin:    General: Skin is warm and dry.  Neurological:     General: No focal deficit present.     Mental Status: She is alert.  Psychiatric:        Mood and Affect: Mood normal.        Behavior: Behavior normal.        Thought Content: Thought content normal.        Judgment: Judgment normal.      Assessment/Plan Left lateral recess stenosis L3-L4.  Left lumbar radiculopathy.  History of fusion L2-L5.  Plan: Laminotomy and foraminotomies L3-4 left.  Stefani Dama, MD 12/15/2023, 3:42 PM

## 2023-12-15 NOTE — Anesthesia Procedure Notes (Signed)
Procedure Name: Intubation Date/Time: 12/15/2023 4:17 PM  Performed by: Loleta Masiah Lewing, CRNAPre-anesthesia Checklist: Patient identified, Patient being monitored, Timeout performed, Emergency Drugs available and Suction available Patient Re-evaluated:Patient Re-evaluated prior to induction Oxygen Delivery Method: Circle system utilized Preoxygenation: Pre-oxygenation with 100% oxygen Induction Type: IV induction Ventilation: Mask ventilation without difficulty Laryngoscope Size: Mac and 4 Grade View: Grade II Tube type: Oral Tube size: 7.0 mm Number of attempts: 1 Airway Equipment and Method: Stylet Placement Confirmation: ETT inserted through vocal cords under direct vision, positive ETCO2 and breath sounds checked- equal and bilateral Secured at: 21 cm Tube secured with: Tape Dental Injury: Teeth and Oropharynx as per pre-operative assessment

## 2023-12-15 NOTE — Transfer of Care (Signed)
Immediate Anesthesia Transfer of Care Note  Patient: [name]  Procedure(s) Performed: Sublaminar decompression - Lumbar Three-Lumbar Four - left (Left)  Patient Location: PACU  Anesthesia Type:General  Level of Consciousness: awake, alert , and oriented  Airway & Oxygen Therapy: Patient Spontanous Breathing  Post-op Assessment: Report given to RN and Post -op Vital signs reviewed and stable  Post vital signs: Reviewed and stable  Last Vitals:  Vitals Value Taken Time  BP 129/72 12/15/23 1752  Temp    Pulse 84 12/15/23 1756  Resp 14 12/15/23 1756  SpO2 95 % 12/15/23 1756  Vitals shown include unfiled device data.  Last Pain:  Vitals:   12/15/23 1432  TempSrc:   PainSc: 7          Complications: No notable events documented.

## 2023-12-15 NOTE — Op Note (Signed)
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 Postoperative diagnosis: Same Procedure: Laminotomy foraminotomies and removal of poorly placed orthopedic hardware from interspace at L3-L4 to decompress the L3 and L4 nerve roots. Surgeon: Barnett Abu Anesthesia: General endotracheal Indications: The patient is a 63 year old individual whose had chronic radiculopathy involving the left lower extremity she has had workups at having had a L2-L5 fusion years ago at and it was noted that an interbody cage on the left side at the L3-4 level had created some lateral recess stenosis on the left side.  This was treated topically with injections but despite the passage of time pain is gotten worse she was advised regarding surgical decompression.  Procedure: Patient was brought to the operating room supine on the stretcher.  After the smooth induction of general tracheal anesthesia she was carefully turned prone.  The back was prepped with alcohol DuraPrep and draped in a sterile fashion.  Midline incision was created and carried down to the lumbodorsal fascia.  Fascia was opened on the left side and localizing radiographs were performed to identify the L3-L4 interspace.  Then the dissection was started in the lateral gutter on the left side at the L3-4 space and a laminotomy was created.  This was taken down to the level of the disc space.  This was done the far lateral projection in the foraminal zone where the transforaminal interbody fusion had been performed.  The cage was identified.  We then drilled the cage down as it was mostly peak with some tantalum markers placed within it.  Tantalum markers were encountered and these were removed.  The dissection was continued until the portion of the plastic protruding dorsally into the canal was removed.  In the end the common dural tube could be dissected free from this area  and a good decompression of the common dural tube and the L4 nerve root inferiorly and the L3 nerve root superiorly was obtained.  Hemostasis was carefully obtained with some Surgifoam which was irrigated away.  20 cc of half percent Marcaine was then injected into the paraspinous space.  The fascia was then closed with #1 Vicryl in interrupted fashion 2-0 Vicryl was used in the subcutaneous tissues 3-0 Vicryl was used to close the subcuticular skin.  Dermabond was placed on the skin.  Blood loss was estimated 75 cc.

## 2023-12-15 NOTE — Anesthesia Postprocedure Evaluation (Signed)
Anesthesia Post Note  Patient: [name]  Procedure(s) Performed: Sublaminar decompression - Lumbar Three-Lumbar Four - left (Left)     Patient location during evaluation: PACU Anesthesia Type: General Level of consciousness: awake and alert Pain management: pain level controlled Vital Signs Assessment: post-procedure vital signs reviewed and stable Respiratory status: spontaneous breathing, nonlabored ventilation, respiratory function stable and patient connected to nasal cannula oxygen Cardiovascular status: blood pressure returned to baseline and stable Postop Assessment: no apparent nausea or vomiting Anesthetic complications: no   No notable events documented.  Last Vitals:  Vitals:   12/15/23 1825 12/15/23 1844  BP: (!) 120/91 (!) 144/77  Pulse: 78 76  Resp: (!) 24 20  Temp: 36.5 C 36.5 C  SpO2: 100% 99%    Last Pain:  Vitals:   12/15/23 1902  TempSrc:   PainSc: 5                  Trevor Iha

## 2023-12-16 ENCOUNTER — Encounter (HOSPITAL_COMMUNITY): Payer: Self-pay | Admitting: Neurological Surgery

## 2023-12-16 DIAGNOSIS — Y828 Other medical devices associated with adverse incidents: Secondary | ICD-10-CM | POA: Diagnosis not present

## 2023-12-16 DIAGNOSIS — M4726 Other spondylosis with radiculopathy, lumbar region: Secondary | ICD-10-CM | POA: Diagnosis not present

## 2023-12-16 DIAGNOSIS — T84226A Displacement of internal fixation device of vertebrae, initial encounter: Secondary | ICD-10-CM | POA: Diagnosis not present

## 2023-12-16 DIAGNOSIS — M48061 Spinal stenosis, lumbar region without neurogenic claudication: Secondary | ICD-10-CM | POA: Diagnosis not present

## 2023-12-16 DIAGNOSIS — Z91040 Latex allergy status: Secondary | ICD-10-CM | POA: Diagnosis not present

## 2023-12-16 DIAGNOSIS — Z79899 Other long term (current) drug therapy: Secondary | ICD-10-CM | POA: Diagnosis not present

## 2023-12-16 MED ORDER — TRAMADOL HCL 50 MG PO TABS
50.0000 mg | ORAL_TABLET | Freq: Four times a day (QID) | ORAL | Status: DC | PRN
  Administered 2023-12-16: 50 mg via ORAL
  Filled 2023-12-16: qty 1

## 2023-12-16 MED ORDER — CYCLOBENZAPRINE HCL 10 MG PO TABS: 10.0000 mg | ORAL_TABLET | Freq: Three times a day (TID) | ORAL | 3 refills | Status: AC | PRN

## 2023-12-16 MED ORDER — DEXAMETHASONE 1 MG PO TABS: ORAL_TABLET | ORAL | 0 refills | Status: AC

## 2023-12-16 MED ORDER — HYDROCODONE-ACETAMINOPHEN 5-325 MG PO TABS: 1.0000 | ORAL_TABLET | ORAL | 0 refills | Status: DC | PRN

## 2023-12-16 NOTE — Plan of Care (Signed)
 Pt doing well. Pt and husband given D/C instructions with verbal understanding. Rx's were sent to the pharmacy by MD. Pt's incision is clean and dry with no sign of infection. Pt's IV was removed prior to D/C. Pt D/C'd home via wheelchair per MD order. Pt is stable @ D/C and has no other needs at this time. Rema Fendt, RN

## 2023-12-16 NOTE — Discharge Summary (Signed)
 Physician Discharge Summary  Patient ID: [name] MRN: [id number] DOB/AGE: May 09, 1961 63 y.o.  Admit date: 12/15/2023 Discharge date: 12/16/2023  Admission Diagnoses: Lumbar radiculopathy L4 left  Discharge Diagnoses: Lumbar radiculopathy L4 left Principal Problem:   Lumbar radiculopathy, chronic   Discharged Condition: good  Hospital Course: Patient tolerated surgery well  Consults: None  Significant Diagnostic Studies: None  Treatments: surgery: See op note  Discharge Exam: Blood pressure 113/73, pulse 92, temperature 98.1 F (36.7 C), temperature source Oral, resp. rate 16, height 5' 4 (1.626 m), weight 74.8 kg, SpO2 100%. Incision is clean and dry Station and gait and motor strength are intact  Disposition: Discharge disposition: 01-Home or Self Care       Discharge Instructions     Call MD for:  redness, tenderness, or signs of infection (pain, swelling, redness, odor or green/yellow discharge around incision site)   Complete by: As directed    Call MD for:  severe uncontrolled pain   Complete by: As directed    Call MD for:  temperature >100.4   Complete by: As directed    Diet - low sodium heart healthy   Complete by: As directed    Discharge instructions   Complete by: As directed    Okay to shower. Do not apply salves or appointments to incision. No heavy lifting with the upper extremities greater than 15 pounds. May resume driving when not requiring pain medication and patient feels comfortable with doing so.   Incentive spirometry RT   Complete by: As directed    Increase activity slowly   Complete by: As directed       Allergies as of 12/16/2023       Reactions   Codeine Nausea Only   Demerol  [meperidine Hcl] Nausea And Vomiting   Erythromycin Base Other (See Comments)   Turns patient jaundice    Augmentin [amoxicillin-pot Clavulanate] Nausea And Vomiting   Latex Rash   Tape Rash   Includes bandaids        Medication List      TAKE these medications    cyclobenzaprine  10 MG tablet Commonly known as: FLEXERIL  Take 1 tablet (10 mg total) by mouth 3 (three) times daily as needed for muscle spasms.   dexamethasone  1 MG tablet Commonly known as: DECADRON  2 tablets twice daily for 2 days, one tablet twice daily for 2 days, one tablet daily for 2 days.   furosemide  40 MG tablet Commonly known as: LASIX  Take 40 mg by mouth in the morning. May take an additional dose if needed for edema   HYDROcodone -acetaminophen  5-325 MG tablet Commonly known as: NORCO/VICODIN Take 1-2 tablets by mouth every 4 (four) hours as needed for moderate pain (pain score 4-6).   rizatriptan 10 MG tablet Commonly known as: MAXALT Take 10 mg by mouth every 2 (two) hours as needed for migraine.   rosuvastatin  20 MG tablet Commonly known as: CRESTOR  Take 1 tablet (20 mg total) by mouth daily.   traMADol  50 MG tablet Commonly known as: ULTRAM  Take 50 mg by mouth every 6 (six) hours as needed (pain.).   Tylenol  Sinus Severe 5-325-200 MG Tabs Generic drug: Phenylephrine -APAP-guaiFENesin Take 1 tablet by mouth daily as needed (sinus pressure).   zolpidem  5 MG tablet Commonly known as: AMBIEN  Take 2.5-5 mg by mouth at bedtime as needed for sleep.         Signed: Victory PARAS Vinson Tietze 12/16/2023, 10:27 AM

## 2023-12-16 NOTE — Evaluation (Signed)
 Occupational Therapy Evaluation Patient Details Name: [name] MRN: [id number] DOB: [birth year] Today's Date: 12/16/2023   History of Present Illness [name] is a 63 y.o. female who presented with chronic left lumbar radiculopathy involving the left lower extremity. MRI demonstrated presence of stenosis at the L3-4 level primarily affecting the path of the L4 nerve root. 12/15/2023, laminotomy and foraminotomies to decompress the path of the L4 nerve root was performed. PMHx includes breast mass (right), chronic back pain, dysrhythmia, hyperlipidemia, peripheral edema, and a history of kidney stones.   Clinical Impression   [name] was evaluated s/p admission list above. At baseline, pt lives with her husband, works from home, drives, and is independent with ADLs/IADLs and functional mobility without use of DME. Upon evaluation, pt was limited by back pain, knowledge of back precautions and compensatory strategies. After education of back precautions and compensatory strategies, anticipates pt is able to complete ADLs with MOD I. Pt husband is supported and willing to assist with ADLs as needed and complete IADLs. Pt observed with good standing balance and ambulated in hallway with independently, without use of DME. Recommends pt discharge home with family support without OT follow up.     If plan is discharge home, recommend the following: Assistance with cooking/housework;Assist for transportation    Functional Status Assessment  Patient has had a recent decline in their functional status and demonstrates the ability to make significant improvements in function in a reasonable and predictable amount of time.  Equipment Recommendations  None recommended by OT    Recommendations for Other Services       Precautions / Restrictions Precautions Precautions: Back Precaution Booklet Issued: Yes (comment) Precaution Comments: Provided with handout of back  precautions Restrictions Weight Bearing Restrictions Per Provider Order: No      Mobility Bed Mobility Overal bed mobility: Modified Independent             General bed mobility comments: Pt received standing unsupported upon arrival. Pt educated on log rolling to complete bed mobility tasks.    Transfers Overall transfer level: Independent Equipment used: None                      Balance Overall balance assessment: Needs assistance Sitting-balance support: Bilateral upper extremity supported, Feet supported Sitting balance-Leahy Scale: Good     Standing balance support: No upper extremity supported, During functional activity Standing balance-Leahy Scale: Good Standing balance comment: ambulated in hallway while dual tasking without use of DME. No LOB observed.                           ADL either performed or assessed with clinical judgement   ADL Overall ADL's : Modified independent                                       General ADL Comments: Pt educated on using compensatory strategies when completing ADLs. Pt verbalized understanding. Anticipates pt is able to complete ADLs with MOD I after education.     Vision Baseline Vision/History: 0 No visual deficits Ability to See in Adequate Light: 0 Adequate Patient Visual Report: No change from baseline Vision Assessment?: No apparent visual deficits     Perception Perception: Within Functional Limits       Praxis Praxis: Not tested       Pertinent Vitals/Pain  Pain Assessment Pain Assessment: Faces Faces Pain Scale: Hurts a little bit Pain Location: back, incision site Pain Descriptors / Indicators: Discomfort Pain Intervention(s): Monitored during session     Extremity/Trunk Assessment Upper Extremity Assessment Upper Extremity Assessment: Overall WFL for tasks assessed   Lower Extremity Assessment Lower Extremity Assessment: Defer to PT evaluation   Cervical /  Trunk Assessment Cervical / Trunk Assessment: Back Surgery   Communication Communication Communication: No apparent difficulties   Cognition Arousal: Alert Behavior During Therapy: WFL for tasks assessed/performed Overall Cognitive Status: Within Functional Limits for tasks assessed                                 General Comments: Answered questions appropriately     General Comments  VSS on room air    Exercises     Shoulder Instructions      Home Living Family/patient expects to be discharged to:: Private residence Living Arrangements: Spouse/significant other Available Help at Discharge: Family Type of Home: House Home Access: Level entry     Home Layout: Laundry or work area in basement     [employer] Shower/Tub: Tub/shower unit;Walk-in shower   Bathroom Toilet: Standard     Home Equipment: Agricultural Consultant (2 wheels);Cane - single point;BSC/3in1;Shower seat   Additional Comments: walk-in shower is in basement but she does not access basement. Will use tub shower with shower seat on main level. Pt also has a sports administrator.      Prior Functioning/Environment Prior Level of Function : Working/employed;Driving;Independent/Modified Independent             Mobility Comments: ambulated without use of DME ADLs Comments: Works from home. Independent with ADLs/IADLs        OT Problem List: Decreased strength;Decreased activity tolerance;Impaired balance (sitting and/or standing);Decreased knowledge of use of DME or AE;Decreased knowledge of precautions;Pain      OT Treatment/Interventions:      OT Goals(Current goals can be found in the care plan section) Acute Rehab OT Goals Patient Stated Goal: go home OT Goal Formulation: With patient Time For Goal Achievement: 12/16/23 Potential to Achieve Goals: Good  OT Frequency:      Co-evaluation              AM-PAC OT 6 Clicks Daily Activity     Outcome Measure Help from another person eating  meals?: None Help from another person taking care of personal grooming?: None Help from another person toileting, which includes using toliet, bedpan, or urinal?: None Help from another person bathing (including washing, rinsing, drying)?: None Help from another person to put on and taking off regular upper body clothing?: None Help from another person to put on and taking off regular lower body clothing?: None 6 Click Score: 24   End of Session Equipment Utilized During Treatment: Gait belt Nurse Communication: Mobility status  Activity Tolerance: Patient tolerated treatment well Patient left: Other (comment);with family/visitor present (standing in room with husband present)  OT Visit Diagnosis: Unsteadiness on feet (R26.81);Other abnormalities of gait and mobility (R26.89);Muscle weakness (generalized) (M62.81);Pain Pain - Right/Left: Left Pain - part of body:  (lumbar pain; incision site)                Time: 9155-9146 OT Time Calculation (min): 9 min Charges:  OT General Charges $OT Visit: 1 Visit OT Evaluation $OT Eval Low Complexity: 1 Low  [address] [facility]  Bennette Hasty 12/16/2023, 9:26 AM

## 2023-12-16 NOTE — Progress Notes (Signed)
 PT Cancellation Note and Discharge  Patient Details Name: [name] MRN: [id number] DOB: [birth year]   Cancelled Treatment:    Reason Eval/Treat Not Completed: PT screened, no needs identified, will sign off. Discussed pt case with OT who reports pt is currently mobilizing at a modified independent level and does not require a formal PT evaluation at this time. PT signing off. If needs change, please reconsult.     [name] 12/16/2023, 9:59 AM  [name], PT, DPT Acute Rehabilitation Services Secure Chat Preferred Office: [phone]

## 2024-01-26 DIAGNOSIS — R609 Edema, unspecified: Secondary | ICD-10-CM | POA: Diagnosis not present

## 2024-01-26 DIAGNOSIS — K760 Fatty (change of) liver, not elsewhere classified: Secondary | ICD-10-CM | POA: Diagnosis not present

## 2024-01-26 DIAGNOSIS — R7303 Prediabetes: Secondary | ICD-10-CM | POA: Diagnosis not present

## 2024-01-26 DIAGNOSIS — E7800 Pure hypercholesterolemia, unspecified: Secondary | ICD-10-CM | POA: Diagnosis not present

## 2024-03-05 DIAGNOSIS — G2581 Restless legs syndrome: Secondary | ICD-10-CM | POA: Diagnosis not present

## 2024-03-05 DIAGNOSIS — Z6829 Body mass index (BMI) 29.0-29.9, adult: Secondary | ICD-10-CM | POA: Diagnosis not present

## 2024-03-17 ENCOUNTER — Other Ambulatory Visit: Payer: Self-pay | Admitting: Physician Assistant

## 2024-03-17 DIAGNOSIS — Z1231 Encounter for screening mammogram for malignant neoplasm of breast: Secondary | ICD-10-CM

## 2024-04-12 DIAGNOSIS — Z6830 Body mass index (BMI) 30.0-30.9, adult: Secondary | ICD-10-CM | POA: Diagnosis not present

## 2024-04-12 DIAGNOSIS — Z01419 Encounter for gynecological examination (general) (routine) without abnormal findings: Secondary | ICD-10-CM | POA: Diagnosis not present

## 2024-04-28 DIAGNOSIS — M79641 Pain in right hand: Secondary | ICD-10-CM | POA: Diagnosis not present

## 2024-04-28 DIAGNOSIS — M1811 Unilateral primary osteoarthritis of first carpometacarpal joint, right hand: Secondary | ICD-10-CM | POA: Diagnosis not present

## 2024-05-25 DIAGNOSIS — M1811 Unilateral primary osteoarthritis of first carpometacarpal joint, right hand: Secondary | ICD-10-CM | POA: Diagnosis not present

## 2024-06-07 DIAGNOSIS — Z01818 Encounter for other preprocedural examination: Secondary | ICD-10-CM | POA: Diagnosis not present

## 2024-07-09 ENCOUNTER — Ambulatory Visit
Admission: RE | Admit: 2024-07-09 | Discharge: 2024-07-09 | Disposition: A | Discharge status: Home / Self Care | Source: Ambulatory Visit | Attending: Physician Assistant | Admitting: Physician Assistant

## 2024-07-09 DIAGNOSIS — Z1231 Encounter for screening mammogram for malignant neoplasm of breast: Secondary | ICD-10-CM | POA: Diagnosis not present

## 2024-07-14 DIAGNOSIS — M1811 Unilateral primary osteoarthritis of first carpometacarpal joint, right hand: Secondary | ICD-10-CM | POA: Diagnosis not present

## 2024-07-15 DIAGNOSIS — E785 Hyperlipidemia, unspecified: Secondary | ICD-10-CM | POA: Diagnosis not present

## 2024-07-15 DIAGNOSIS — D124 Benign neoplasm of descending colon: Secondary | ICD-10-CM | POA: Diagnosis not present

## 2024-07-15 DIAGNOSIS — Z1211 Encounter for screening for malignant neoplasm of colon: Secondary | ICD-10-CM | POA: Diagnosis not present

## 2024-07-15 DIAGNOSIS — Z881 Allergy status to other antibiotic agents status: Secondary | ICD-10-CM | POA: Diagnosis not present

## 2024-07-15 DIAGNOSIS — K648 Other hemorrhoids: Secondary | ICD-10-CM | POA: Diagnosis not present

## 2024-07-15 DIAGNOSIS — K635 Polyp of colon: Secondary | ICD-10-CM | POA: Diagnosis not present

## 2024-07-15 DIAGNOSIS — D126 Benign neoplasm of colon, unspecified: Secondary | ICD-10-CM | POA: Diagnosis not present

## 2024-07-15 DIAGNOSIS — K623 Rectal prolapse: Secondary | ICD-10-CM | POA: Diagnosis not present

## 2024-07-15 DIAGNOSIS — Z79899 Other long term (current) drug therapy: Secondary | ICD-10-CM | POA: Diagnosis not present

## 2024-07-16 DIAGNOSIS — M79641 Pain in right hand: Secondary | ICD-10-CM | POA: Diagnosis not present

## 2024-08-19 ENCOUNTER — Encounter: Payer: Self-pay | Admitting: Podiatry

## 2024-08-19 ENCOUNTER — Ambulatory Visit (INDEPENDENT_AMBULATORY_CARE_PROVIDER_SITE_OTHER)

## 2024-08-19 ENCOUNTER — Ambulatory Visit: Admitting: Podiatry

## 2024-08-19 DIAGNOSIS — M205X1 Other deformities of toe(s) (acquired), right foot: Secondary | ICD-10-CM

## 2024-08-19 DIAGNOSIS — M216X1 Other acquired deformities of right foot: Secondary | ICD-10-CM

## 2024-08-19 NOTE — Patient Instructions (Signed)
 Look for urea 40% cream or ointment and apply to the thickened dry skin / calluses. This can be bought over the counter, at a pharmacy or online such as [employer].  More silicone and felt pads can be purchased from:  [email or web address]  You can also find callus pads on [employer].  Recommend using dancers pads to offload the fifth metatarsal head.  You can also put these in your shoes underneath your insoles to help add more padding.  I would try getting pads that are left and right sided to experiment with which is the most comfortable.  I also recommend wearing good stiff soled supportive shoes with good arch support and using a good-quality over-the-counter or custom orthotic for arch support which may help improve the first toe mobility on the right foot.

## 2024-08-19 NOTE — Progress Notes (Signed)
 Subjective:  Patient ID: [name], female    DOB: 08-04-1961,  MRN: [id number]  Chief Complaint  Patient presents with   Callouses    Callous X 2 on the right foot, 1. Sub-met 5, 2. Medial hallux.  Both are painful at times, with and with out pressure and walking. Pain is not constant.  Not diabetic and no anti coag.     Discussed the use of AI scribe software for clinical note transcription with the patient, who gave verbal consent to proceed.  History of Present Illness [name] is a 63 year old female who presents with right big toe pain and callus formation.  She experiences intermittent pain in the right big toe, particularly inside the toe and at the interphalangeal joint. The pain occurs while upon first standing in the morning, movement of the toe and feels like inflammation. It is more pronounced when bending the toe at the inter phalangeal joint, with soreness on the top of the toe when pressed.  Also reports discomfort is present under the fifth metatarsal head of her right foot when walking, though it is not sore to the touch.  She does get callus formation here.  She works from home, typically staying barefoot, and only wears shoes when going outside. Her home has hard floors.  No pain is noted at the joint behind the big toe or when pressing on the fifth metatarsal head.      Objective:    Physical Exam CARDIOVASCULAR: DP and PT pulses intact, +1 pitting edema to foot and lower leg. Capillary refill intact to toes. MUSCULOSKELETAL: Good pedal range of motion.  Right foot functional hallux limitus with forefoot loading. Prominent fifth metatarsal head right foot. Tenderness on palpation of dorsal right first toe proximal phalanx along EHL tendon. Discomfort with plantar flexion of interphalangeal joint of left first toe. SKIN: Calluses under right sub fifth metatarsal, first metatarsal head, and first toe.  There is some hypermobility of the first TMT  J noted right foot NEUROLOGICAL: Light touch sensation intact.  Protective sensation intact.   No images are attached to the encounter.    Results RADIOLOGY Foot X-ray: Three views, weight bearing, no acute fractures. Joint space preserved. Evidence of prior surgery with retained hardware in the distal second and third metatarsals. Slight increased lateral prominence of the fifth metatarsal head with slight plantar flexion of the fifth metatarsal head. Overall, rectus foot type without significant change in Meary's angle.   Assessment:   1. Hallux limitus of right foot      Plan:  Patient was evaluated and treated and all questions answered.  Assessment and Plan Assessment & Plan Right big toe pain with functional hallux limitus Intermittent pain at the interphalangeal joint with functional hallux limitus during forefoot loading. Pain likely due to stiffness and strain during gait. - Order updated x-rays to assess foot structure. - Recommend use of good stiff soled supportive shoe - RICE therapy and over-the-counter NSAIDs as needed - Consider corticosteroid injection if pain ongoing or flaring up - Did discuss use of a quality over-the-counter insert or custom insert for arch support which may limit hypermobility of the first ray and decrease the functional hallux limitus.  Calluses of right foot Calluses under right sub fifth metatarsal, first metatarsal head, and first toe due to pressure from gait and loss of cushioning. - Discussed home management strategies for calluses. - Offered in-office treatment for calluses, deferring this today, ABN on file going  forward if needed.   -Recommend use of over-the-counter urea cream 40% 1-2 times a day to soften calluses.  This will make it easier for her to file down with a pumice stone or emery board. - Offloading dancers pad applied to the subfifth metatarsal head.  Recommend she continue padding and offloading of the affected  areas.  Right foot pain under fifth metatarsal head (metatarsalgia) Pain under fifth metatarsal head during walking, with slight increased lateral prominence and plantar flexion on x-ray.   Follow-Up as needed      No follow-ups on file.

## 2024-08-25 DIAGNOSIS — M1811 Unilateral primary osteoarthritis of first carpometacarpal joint, right hand: Secondary | ICD-10-CM | POA: Diagnosis not present

## 2024-08-26 ENCOUNTER — Other Ambulatory Visit: Payer: Self-pay | Admitting: Orthopedic Surgery

## 2024-08-26 ENCOUNTER — Telehealth (HOSPITAL_BASED_OUTPATIENT_CLINIC_OR_DEPARTMENT_OTHER): Payer: Self-pay | Admitting: *Deleted

## 2024-08-26 ENCOUNTER — Telehealth: Payer: Self-pay | Admitting: Cardiology

## 2024-08-26 NOTE — Telephone Encounter (Signed)
 Left a message for pt to call back and schedule a tele preop appt.

## 2024-08-26 NOTE — Telephone Encounter (Signed)
   Name: [name]  DOB: 1961/11/02  MRN: [id number]  Primary Cardiologist: Oneil Parchment, MD  Preoperative team, please contact this patient and set up a phone call appointment for further preoperative risk assessment. Please obtain consent and complete medication review. Thank you for your help.  I confirm that guidance regarding antiplatelet and oral anticoagulation therapy has been completed and, if necessary, noted below.  None requested.   I also confirmed the patient resides in the state of [place] . As per [facility] Medical Board telemedicine laws, the patient must reside in the state in which the provider is licensed.   Saraya Tirey D Gerardo Territo, NP 08/26/2024, 9:11 AM [facility]

## 2024-08-26 NOTE — Telephone Encounter (Signed)
   Pre-operative Risk Assessment    Patient Name: [name]  DOB: July 06, 1961 MRN: [id number]   Date of last office visit: 10/31/23  Date of next office visit: n/a    Request for Surgical Clearance    Procedure:  Right thumb trapeziectomy and suspensionplasty   Date of Surgery:  Clearance 10/14/24                                Surgeon:  Dr. Murrell Socks Group or Practice Name:  [facility]  Phone number:  [phone]  Fax number:  [phone]    Type of Clearance Requested:   - Medical    Type of Anesthesia:  MAC   Additional requests/questions:    [name]   08/26/2024, 9:02 AM

## 2024-08-26 NOTE — Telephone Encounter (Signed)
 Patient returned call, she said to call her back on her work number at [phone].

## 2024-08-26 NOTE — Telephone Encounter (Signed)
 I s/w the pt and she has been scheduled tele preop appt 09/20/24. Med rec and consent are done.      Patient Consent for Virtual Visit        [name] has provided verbal consent on 08/26/2024 for a virtual visit (video or telephone).   CONSENT FOR VIRTUAL VISIT FOR:  [name]  By participating in this virtual visit I agree to the following:  I hereby voluntarily request, consent and authorize [facility] and its employed or contracted physicians, physician assistants, nurse practitioners or other licensed health care professionals (the Practitioner), to provide me with telemedicine health care services (the "Services) as deemed necessary by the treating Practitioner. I acknowledge and consent to receive the Services by the Practitioner via telemedicine. I understand that the telemedicine visit will involve communicating with the Practitioner through live audiovisual communication technology and the disclosure of certain medical information by electronic transmission. I acknowledge that I have been given the opportunity to request an in-person assessment or other available alternative prior to the telemedicine visit and am voluntarily participating in the telemedicine visit.  I understand that I have the right to withhold or withdraw my consent to the use of telemedicine in the course of my care at any time, without affecting my right to future care or treatment, and that the Practitioner or I may terminate the telemedicine visit at any time. I understand that I have the right to inspect all information obtained and/or recorded in the course of the telemedicine visit and may receive copies of available information for a reasonable fee.  I understand that some of the potential risks of receiving the Services via telemedicine include:  Delay or interruption in medical evaluation due to technological equipment failure or disruption; Information transmitted may not be  sufficient (e.g. poor resolution of images) to allow for appropriate medical decision making by the Practitioner; and/or  In rare instances, security protocols could fail, causing a breach of personal health information.  Furthermore, I acknowledge that it is my responsibility to provide information about my medical history, conditions and care that is complete and accurate to the best of my ability. I acknowledge that Practitioner's advice, recommendations, and/or decision may be based on factors not within their control, such as incomplete or inaccurate data provided by me or distortions of diagnostic images or specimens that may result from electronic transmissions. I understand that the practice of medicine is not an exact science and that Practitioner makes no warranties or guarantees regarding treatment outcomes. I acknowledge that a copy of this consent can be made available to me via my patient portal [facility] MyChart), or I can request a printed copy by calling the office of [facility].    I understand that my insurance will be billed for this visit.   I have read or had this consent read to me. I understand the contents of this consent, which adequately explains the benefits and risks of the Services being provided via telemedicine.  I have been provided ample opportunity to ask questions regarding this consent and the Services and have had my questions answered to my satisfaction. I give my informed consent for the services to be provided through the use of telemedicine in my medical care

## 2024-08-26 NOTE — Telephone Encounter (Signed)
 I s/w the pt and she has been scheduled tele preop appt 09/20/24. Med rec and consent are done.

## 2024-09-02 DIAGNOSIS — K760 Fatty (change of) liver, not elsewhere classified: Secondary | ICD-10-CM | POA: Diagnosis not present

## 2024-09-02 DIAGNOSIS — Z1331 Encounter for screening for depression: Secondary | ICD-10-CM | POA: Diagnosis not present

## 2024-09-02 DIAGNOSIS — R609 Edema, unspecified: Secondary | ICD-10-CM | POA: Diagnosis not present

## 2024-09-02 DIAGNOSIS — R7303 Prediabetes: Secondary | ICD-10-CM | POA: Diagnosis not present

## 2024-09-02 DIAGNOSIS — Z23 Encounter for immunization: Secondary | ICD-10-CM | POA: Diagnosis not present

## 2024-09-02 DIAGNOSIS — E7800 Pure hypercholesterolemia, unspecified: Secondary | ICD-10-CM | POA: Diagnosis not present

## 2024-09-20 ENCOUNTER — Ambulatory Visit: Admitting: Emergency Medicine

## 2024-09-20 DIAGNOSIS — Z01810 Encounter for preprocedural cardiovascular examination: Secondary | ICD-10-CM

## 2024-09-20 NOTE — Progress Notes (Signed)
 Virtual Visit via Telephone Note   Because of DEMETRIUS BARRELL co-morbid illnesses, she is at least at moderate risk for complications without adequate follow up.  This format is felt to be most appropriate for this patient at this time.  Due to technical limitations with video connection (technology), today's appointment will be conducted as an audio only telehealth visit, and [name] verbally agreed to proceed in this manner.   All issues noted in this document were discussed and addressed.  No physical exam could be performed with this format.  Evaluation Performed:  Preoperative cardiovascular risk assessment _____________   Date:  09/20/2024   Patient ID:  [name], DOB 1961/08/31, MRN [id number] Patient Location:  Home Provider location:   Office  Primary Care Provider:  Montey Lot, PA-C Primary Cardiologist:  Oneil Parchment, MD  Chief Complaint / Patient Profile   63 y.o. y/o female with a h/o LBBB, HLD, peripheral edema who is pending Right thumb trapeziectomy and suspensionplasty on 10/14/2024 and presents today for telephonic preoperative cardiovascular risk assessment.  History of Present Illness    [name] is a 63 y.o. female who presents via audio/video conferencing for a telehealth visit today.  Pt was last seen in cardiology clinic on 10/31/2023 by Dr. Parchment.  At that time [name] was doing well, and was cleared for spinal surgery at that time following a low risk cardiac stress test and reassuring echo. The patient is now pending procedure as outlined above. Since her last visit, She denies chest pain, palpitations, dyspnea, orthopnea, n, v, dark/tarry/bloody stools, hematuria, dizziness, syncope, edema, weight gain.    Past Medical History    Past Medical History:  Diagnosis Date   Breast mass, right    Chronic back pain    Dysrhythmia    LBBB   Headache    History of kidney stones    1990 & 2020   Hyperlipidemia     Peripheral edema    Past Surgical History:  Procedure Laterality Date   ABDOMINAL HYSTERECTOMY     BREAST BIOPSY Right 11/18/2005   BREAST EXCISIONAL BIOPSY     BREAST LUMPECTOMY WITH RADIOACTIVE SEED LOCALIZATION Right 09/09/2022   Procedure: RIGHT BREAST LUMPECTOMY WITH RADIOACTIVE SEED LOCALIZATION;  Surgeon: Curvin Deward MOULD, MD;  Location: [facility];  Service: General;  Laterality: Right;   CERVICAL FUSION     CHOLECYSTECTOMY     DECOMPRESSIVE LUMBAR LAMINECTOMY LEVEL 1 Left 12/15/2023   Procedure: Sublaminar decompression - Lumbar Three-Lumbar Four - left;  Surgeon: Colon Shove, MD;  Location: [facility] OR;  Service: Neurosurgery;  Laterality: Left;  C3   LUMBAR FUSION     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 Authorizing Provider  cyclobenzaprine  (FLEXERIL ) 10 MG tablet Take 1 tablet (10 mg total) by mouth 3 (three) times daily as needed for muscle spasms. 12/16/23   Colon Shove, MD  dexamethasone  (DECADRON ) 1 MG tablet 2 tablets twice daily for 2  days, one tablet twice daily for 2 days, one tablet daily for 2 days. 12/16/23   Colon Shove, MD  furosemide  (LASIX ) 40 MG tablet Take 40 mg by mouth in the morning. May take an additional dose if needed for edema    [provider]  HYDROcodone -acetaminophen  (NORCO/VICODIN) 5-325 MG tablet Take 1-2 tablets by mouth every 4 (four) hours as needed for moderate pain (pain score 4-6). 12/16/23 12/15/24  Colon Shove, MD  Phenylephrine -APAP-guaiFENesin (TYLENOL  SINUS SEVERE) 5-325-200 MG TABS Take 1 tablet by mouth daily as  needed (sinus pressure).    [provider]  rizatriptan (MAXALT) 10 MG tablet Take 10 mg by mouth every 2 (two) hours as needed for migraine.    [provider]  rosuvastatin  (CRESTOR ) 20 MG tablet Take 1 tablet (20 mg total) by mouth daily. 10/31/23   Jeffrie Oneil BROCKS, MD  traMADol  (ULTRAM ) 50 MG tablet Take 50 mg by mouth every 6 (six) hours as needed (pain.).    [provider]  zolpidem  (AMBIEN ) 5 MG tablet Take 2.5-5 mg by mouth at bedtime as needed for sleep. 08/13/18   [provider]    Physical Exam    Vital Signs:  [name] does not have vital signs available for review today.  Given telephonic nature of communication, physical exam is limited. AAOx3. NAD. Normal affect.  Speech and respirations are unlabored.  Accessory Clinical Findings    None  Assessment & Plan    1.  Preoperative Cardiovascular Risk Assessment: According to the Revised Cardiac Risk Index (RCRI), her Perioperative Risk of Major Cardiac Event is (%): 0.4  Her Functional Capacity in METs is: 5.07 according to the Duke Activity Status Index (DASI). Therefore, based on ACC/AHA guidelines, patient would be at acceptable risk for the planned procedure without further cardiovascular testing.   The patient was advised that if she develops new symptoms prior to surgery to contact our office to arrange for a follow-up visit, and she verbalized understanding.  A copy of this note will be routed to requesting surgeon.  Time:   Today, I have spent 5 minutes with the patient with telehealth technology discussing medical history, symptoms, and management plan.     Annette Bertelson E Timmy Bubeck, NP  09/20/2024, 10:47 AM

## 2024-10-04 ENCOUNTER — Encounter (HOSPITAL_BASED_OUTPATIENT_CLINIC_OR_DEPARTMENT_OTHER): Payer: Self-pay | Admitting: Orthopedic Surgery

## 2024-10-11 ENCOUNTER — Encounter (HOSPITAL_BASED_OUTPATIENT_CLINIC_OR_DEPARTMENT_OTHER)
Admission: RE | Admit: 2024-10-11 | Discharge: 2024-10-11 | Disposition: A | Source: Ambulatory Visit | Attending: Orthopedic Surgery

## 2024-10-11 DIAGNOSIS — R030 Elevated blood-pressure reading, without diagnosis of hypertension: Secondary | ICD-10-CM | POA: Insufficient documentation

## 2024-10-11 DIAGNOSIS — M1811 Unilateral primary osteoarthritis of first carpometacarpal joint, right hand: Secondary | ICD-10-CM | POA: Diagnosis not present

## 2024-10-11 DIAGNOSIS — Z01812 Encounter for preprocedural laboratory examination: Secondary | ICD-10-CM | POA: Insufficient documentation

## 2024-10-11 LAB — BASIC METABOLIC PANEL WITH GFR
Anion gap: 13 (ref 5–15)
BUN: 10 mg/dL (ref 8–23)
CO2: 29 mmol/L (ref 22–32)
Calcium: 9.4 mg/dL (ref 8.9–10.3)
Chloride: 104 mmol/L (ref 98–111)
Creatinine, Ser: 0.6 mg/dL (ref 0.44–1.00)
GFR, Estimated: >60 mL/min (ref >60)
Glucose, Bld: 108 mg/dL — ABNORMAL HIGH (ref 70–99)
Potassium: 4.4 mmol/L (ref 3.5–5.1)
Sodium: 146 mmol/L — ABNORMAL HIGH (ref 135–145)

## 2024-10-11 NOTE — Progress Notes (Signed)

## 2024-10-14 ENCOUNTER — Ambulatory Visit (HOSPITAL_BASED_OUTPATIENT_CLINIC_OR_DEPARTMENT_OTHER): Admitting: Anesthesiology

## 2024-10-14 ENCOUNTER — Ambulatory Visit (HOSPITAL_BASED_OUTPATIENT_CLINIC_OR_DEPARTMENT_OTHER)
Admission: RE | Admit: 2024-10-14 | Discharge: 2024-10-14 | Disposition: A | Attending: Orthopedic Surgery | Admitting: Orthopedic Surgery

## 2024-10-14 ENCOUNTER — Ambulatory Visit (HOSPITAL_BASED_OUTPATIENT_CLINIC_OR_DEPARTMENT_OTHER)

## 2024-10-14 ENCOUNTER — Other Ambulatory Visit: Payer: Self-pay

## 2024-10-14 ENCOUNTER — Encounter (HOSPITAL_BASED_OUTPATIENT_CLINIC_OR_DEPARTMENT_OTHER): Payer: Self-pay | Admitting: Orthopedic Surgery

## 2024-10-14 ENCOUNTER — Encounter (HOSPITAL_BASED_OUTPATIENT_CLINIC_OR_DEPARTMENT_OTHER)
Admission: RE | Disposition: A | Discharge status: Home / Self Care | Payer: Self-pay | Source: Home / Self Care | Attending: Orthopedic Surgery

## 2024-10-14 DIAGNOSIS — R030 Elevated blood-pressure reading, without diagnosis of hypertension: Secondary | ICD-10-CM

## 2024-10-14 DIAGNOSIS — M1811 Unilateral primary osteoarthritis of first carpometacarpal joint, right hand: Secondary | ICD-10-CM | POA: Diagnosis not present

## 2024-10-14 HISTORY — PX: CARPOMETACARPEL SUSPENSION PLASTY: SHX5005

## 2024-10-14 HISTORY — DX: Left bundle-branch block, unspecified: I44.7

## 2024-10-14 SURGERY — CARPOMETACARPEL (CMC) SUSPENSION PLASTY
Anesthesia: Monitor Anesthesia Care | Site: Thumb | Laterality: Right

## 2024-10-14 MED ORDER — ACETAMINOPHEN 500 MG PO TABS
1000.0000 mg | ORAL_TABLET | Freq: Once | ORAL | Status: AC
  Administered 2024-10-14: 1000 mg via ORAL

## 2024-10-14 MED ORDER — OXYCODONE-ACETAMINOPHEN 5-325 MG PO TABS: 1.0000 | ORAL_TABLET | Freq: Four times a day (QID) | ORAL | 0 refills | Status: AC | PRN

## 2024-10-14 MED ORDER — ONDANSETRON HCL 4 MG/2ML IJ SOLN
INTRAMUSCULAR | Status: DC | PRN
  Administered 2024-10-14: 4 mg via INTRAVENOUS

## 2024-10-14 MED ORDER — LIDOCAINE HCL (PF) 2 % IJ SOLN
INTRAMUSCULAR | Status: DC | PRN
  Administered 2024-10-14: 40 mg via INTRADERMAL

## 2024-10-14 MED ORDER — LACTATED RINGERS IV SOLN: INTRAVENOUS | Status: DC

## 2024-10-14 MED ORDER — PROPOFOL 500 MG/50ML IV EMUL
INTRAVENOUS | Status: DC | PRN
  Administered 2024-10-14: 150 ug/kg/min via INTRAVENOUS

## 2024-10-14 MED ORDER — PHENYLEPHRINE 80 MCG/ML (10ML) SYRINGE FOR IV PUSH (FOR BLOOD PRESSURE SUPPORT)
PREFILLED_SYRINGE | INTRAVENOUS | Status: DC | PRN
  Administered 2024-10-14: 80 ug via INTRAVENOUS
  Administered 2024-10-14: 40 ug via INTRAVENOUS
  Administered 2024-10-14 (×2): 80 ug via INTRAVENOUS

## 2024-10-14 MED ORDER — PHENYLEPHRINE 80 MCG/ML (10ML) SYRINGE FOR IV PUSH (FOR BLOOD PRESSURE SUPPORT)
PREFILLED_SYRINGE | INTRAVENOUS | Status: AC
  Filled 2024-10-14: qty 40

## 2024-10-14 MED ORDER — FENTANYL CITRATE (PF) 100 MCG/2ML IJ SOLN
100.0000 ug | Freq: Once | INTRAMUSCULAR | Status: AC
  Administered 2024-10-14: 50 ug via INTRAVENOUS

## 2024-10-14 MED ORDER — SUCCINYLCHOLINE CHLORIDE 200 MG/10ML IV SOSY
PREFILLED_SYRINGE | INTRAVENOUS | Status: AC
  Filled 2024-10-14: qty 10

## 2024-10-14 MED ORDER — MIDAZOLAM HCL 2 MG/2ML IJ SOLN
INTRAMUSCULAR | Status: AC
  Filled 2024-10-14: qty 2

## 2024-10-14 MED ORDER — DEXAMETHASONE SOD PHOSPHATE PF 10 MG/ML IJ SOLN
INTRAMUSCULAR | Status: DC | PRN
  Administered 2024-10-14: 5 mg via INTRAVENOUS

## 2024-10-14 MED ORDER — FENTANYL CITRATE (PF) 100 MCG/2ML IJ SOLN
INTRAMUSCULAR | Status: AC
  Filled 2024-10-14: qty 2

## 2024-10-14 MED ORDER — ACETAMINOPHEN 500 MG PO TABS
ORAL_TABLET | ORAL | Status: AC
  Filled 2024-10-14: qty 2

## 2024-10-14 MED ORDER — FENTANYL CITRATE (PF) 100 MCG/2ML IJ SOLN: 25.0000 ug | INTRAMUSCULAR | Status: DC | PRN

## 2024-10-14 MED ORDER — CEFAZOLIN SODIUM-DEXTROSE 2-3 GM-%(50ML) IV SOLR
INTRAVENOUS | Status: DC | PRN
  Administered 2024-10-14: 2 g via INTRAVENOUS

## 2024-10-14 MED ORDER — DROPERIDOL 2.5 MG/ML IJ SOLN: 0.6250 mg | Freq: Once | INTRAMUSCULAR | Status: DC | PRN

## 2024-10-14 MED ORDER — PROPOFOL 10 MG/ML IV BOLUS
INTRAVENOUS | Status: DC | PRN
  Administered 2024-10-14: 30 mg via INTRAVENOUS

## 2024-10-14 MED ORDER — OXYCODONE HCL 5 MG PO TABS: 5.0000 mg | ORAL_TABLET | Freq: Once | ORAL | Status: DC | PRN

## 2024-10-14 MED ORDER — MIDAZOLAM HCL (PF) 2 MG/2ML IJ SOLN
2.0000 mg | Freq: Once | INTRAMUSCULAR | Status: AC
  Administered 2024-10-14: 2 mg via INTRAVENOUS

## 2024-10-14 MED ORDER — OXYCODONE HCL 5 MG/5ML PO SOLN: 5.0000 mg | Freq: Once | ORAL | Status: DC | PRN

## 2024-10-14 MED ORDER — CLONIDINE HCL (ANALGESIA) 100 MCG/ML EP SOLN
EPIDURAL | Status: DC | PRN
  Administered 2024-10-14: 100 ug

## 2024-10-14 MED ORDER — 0.9 % SODIUM CHLORIDE (POUR BTL) OPTIME
TOPICAL | Status: DC | PRN
  Administered 2024-10-14: 75 mL

## 2024-10-14 MED ORDER — ONDANSETRON HCL 4 MG/2ML IJ SOLN
INTRAMUSCULAR | Status: AC
  Filled 2024-10-14: qty 4

## 2024-10-14 MED ORDER — BUPIVACAINE-EPINEPHRINE (PF) 0.5% -1:200000 IJ SOLN
INTRAMUSCULAR | Status: DC | PRN
  Administered 2024-10-14: 30 mL via PERINEURAL

## 2024-10-14 SURGICAL SUPPLY — 53 items
BLADE MINI RND TIP GREEN BEAV (BLADE) ×2 IMPLANT
BLADE SURG 15 STRL LF DISP TIS (BLADE) ×4 IMPLANT
BNDG COMPR ESMARK 4X3 LF (GAUZE/BANDAGES/DRESSINGS) ×2 IMPLANT
BNDG ELASTIC 2INX 5YD STR LF (GAUZE/BANDAGES/DRESSINGS) IMPLANT
BNDG ELASTIC 3INX 5YD STR LF (GAUZE/BANDAGES/DRESSINGS) IMPLANT
BNDG GAUZE DERMACEA FLUFF 4 (GAUZE/BANDAGES/DRESSINGS) ×2 IMPLANT
CHLORAPREP W/TINT 26 (MISCELLANEOUS) ×2 IMPLANT
CORD BIPOLAR FORCEPS 12FT (ELECTRODE) ×2 IMPLANT
COVER BACK TABLE 60X90IN (DRAPES) ×2 IMPLANT
COVER MAYO STAND STRL (DRAPES) ×2 IMPLANT
CUFF TOURN SGL QUICK 18X4 (TOURNIQUET CUFF) ×2 IMPLANT
DRAPE EXTREMITY T 121X128X90 (DISPOSABLE) ×2 IMPLANT
DRAPE OEC MINIVIEW 54X84 (DRAPES) ×2 IMPLANT
DRAPE SURG 17X23 STRL (DRAPES) ×2 IMPLANT
GAUZE SPONGE 4X4 12PLY STRL (GAUZE/BANDAGES/DRESSINGS) ×2 IMPLANT
GAUZE XEROFORM 1X8 LF (GAUZE/BANDAGES/DRESSINGS) ×2 IMPLANT
GLOVE BIO SURGEON STRL SZ7.5 (GLOVE) ×2 IMPLANT
GLOVE BIOGEL PI IND STRL 6.5 (GLOVE) IMPLANT
GLOVE BIOGEL PI IND STRL 7.0 (GLOVE) IMPLANT
GLOVE BIOGEL PI IND STRL 8 (GLOVE) ×2 IMPLANT
GLOVE BIOGEL PI IND STRL 8.5 (GLOVE) IMPLANT
GLOVE SURG ORTHO 8.0 STRL STRW (GLOVE) IMPLANT
GLOVE SURG SYN 7.5 PF PI (GLOVE) IMPLANT
GLOVE SURG SYN 8.0 PF PI (GLOVE) IMPLANT
GOWN STRL REUS W/ TWL LRG LVL3 (GOWN DISPOSABLE) ×2 IMPLANT
GOWN STRL REUS W/TWL XL LVL3 (GOWN DISPOSABLE) ×2 IMPLANT
KIT BUTTON SUT MICROLINK LP (Anchor) IMPLANT
NDL HYPO 25X1 1.5 SAFETY (NEEDLE) IMPLANT
NDL KEITH (NEEDLE) IMPLANT
NDL SAFETY ECLIPSE 18X1.5 (NEEDLE) IMPLANT
NEEDLE HYPO 25X1 1.5 SAFETY (NEEDLE) IMPLANT
NEEDLE KEITH (NEEDLE) IMPLANT
PACK BASIN DAY SURGERY FS (CUSTOM PROCEDURE TRAY) ×2 IMPLANT
PAD CAST 3X4 CTTN HI CHSV (CAST SUPPLIES) ×2 IMPLANT
PAD CAST 4YDX4 CTTN HI CHSV (CAST SUPPLIES) IMPLANT
PADDING CAST ABS COTTON 4X4 ST (CAST SUPPLIES) ×2 IMPLANT
SLEEVE SCD COMPRESS KNEE MED (STOCKING) IMPLANT
SLING ARM FOAM STRAP MED (SOFTGOODS) IMPLANT
SPLINT PLASTER CAST FAST 5X30 (CAST SUPPLIES) IMPLANT
SPLINT PLASTER CAST XFAST 3X15 (CAST SUPPLIES) IMPLANT
SPLINT PLASTER CAST XFAST 4X15 (CAST SUPPLIES) IMPLANT
STOCKINETTE 4X48 STRL (DRAPES) ×2 IMPLANT
SUT ETHIBOND 3-0 V-5 (SUTURE) IMPLANT
SUT ETHILON 4 0 PS 2 18 (SUTURE) ×2 IMPLANT
SUT MERSILENE 2.0 SH NDLE (SUTURE) IMPLANT
SUT MERSILENE 4 0 P 3 (SUTURE) IMPLANT
SUT VIC AB 4-0 PS2 18 (SUTURE) ×2 IMPLANT
SUT VICRYL 0 SH 27 (SUTURE) IMPLANT
SUTURE FIBERWR 2-0 18 17.9 3/8 (SUTURE) ×2 IMPLANT
SYR BULB EAR ULCER 3OZ GRN STR (SYRINGE) ×2 IMPLANT
SYR CONTROL 10ML LL (SYRINGE) IMPLANT
TOWEL GREEN STERILE FF (TOWEL DISPOSABLE) ×4 IMPLANT
UNDERPAD 30X36 HEAVY ABSORB (UNDERPADS AND DIAPERS) ×2 IMPLANT

## 2024-10-14 NOTE — Op Note (Addendum)
 NAME: [name] MEDICAL RECORD NO: [id number] DATE OF BIRTH: 1961-07-26 FACILITY: [facility] LOCATION: [facility] PHYSICIAN: Shandrika Ambers R. Isabeau Mccalla, MD   OPERATIVE REPORT   DATE OF PROCEDURE: 10/14/24    PREOPERATIVE DIAGNOSIS: Right thumb CMC arthritis   POSTOPERATIVE DIAGNOSIS: Right thumb CMC arthritis   PROCEDURE: Right thumb trapeziectomy and suspensioplasty   SURGEON:  Franky Curia, M.D.   ASSISTANT: Arley Curia, MD   ANESTHESIA:  Regional with sedation   INTRAVENOUS FLUIDS:  Per anesthesia flow sheet.   ESTIMATED BLOOD LOSS:  Minimal.   COMPLICATIONS:  None.   SPECIMENS:  none   TOURNIQUET TIME:    Total Tourniquet Time Documented: Upper Arm (Right) - 42 minutes Total: Upper Arm (Right) - 42 minutes    DISPOSITION:  Stable to PACU.   INDICATIONS: 63 year old female with right thumb CMC arthritis.  She has tried nonoperative measures without lasting relief.  She wishes to proceed with trapeziectomy and suspensioplasty.  Risks, benefits and alternatives of surgery were discussed including the risks of blood loss, infection, damage to nerves, vessels, tendons, ligaments, bone for surgery, need for additional surgery, complications with wound healing, continued pain, stiffness.  She voiced understanding of these risks and elected to proceed.  OPERATIVE COURSE:  After being identified preoperatively by myself,  the patient and I agreed on the procedure and site of the procedure.  The surgical site was marked.  Surgical consent had been signed. Preoperative IV antibiotic prophylaxis was given. She was transferred to the operating room and placed on the operating table in supine position with the right upper extremity on an arm board.  Sedation was induced by the anesthesiologist. A regional block had been performed by anesthesia in preoperative holding.    Right upper extremity was prepped and draped in normal sterile orthopedic fashion.  A surgical pause was  performed between the surgeons, anesthesia, and operating room staff and all were in agreement as to the patient, procedure, and site of procedure.  Tourniquet at the proximal aspect of the extremity was inflated to 250 mmHg after exsanguination of the arm with an Esmarch bandage.  Incision was made over the [facility] joint of the thumb.  This was carried into subcutaneous tissues by spreading technique.  Bipolar electrocautery was used to obtain hemostasis.  The interval between the APL and EPB tendons was made.  The deep branch of the radial artery was identified and protected throughout the case.  Bipolar electrocautery was used to ligate feeder vessels.  The capsule was incised and the trapezium identified.  Was cleared of soft tissue attachments with the knife and Therapist, nutritional.  It was removed with rongeurs.  The FCR tendon was placed under traction and half of the tendon harvested creating a distally based stump.  The micro link suture anchor was used.  A guidepin was placed across the base of the thumb metacarpal and through the index finger metacarpal.  C-arm was used in AP and lateral projections to ensure appropriate position of the guidepin across the case.  It was retrieved through a incision on the dorsum of the hand.  This was used to pass the microleak suture.  The suture anchor was then tied over back in the index finger metacarpal while a hemostat was placed between the thumb and index metacarpals to prevent overtightening.  This provided good suspension of the thumb metacarpal.  The FCR tendon was passed through the APL tendon and back onto itself.  This was secured with FiberWire  suture.  Wound was copiously irrigated with sterile saline.  C-arm was used in AP and lateral projections to ensure appropriate suspension which was the case.  Inverted interrupted 4-0 Vicryl sutures were placed in subcutaneous tissues.  The skin wounds were closed with 4-0 nylon in a horizontal mattress fashion.  The wounds  were dressed with sterile Xeroform 4 x 4's and wrapped with a Kerlix bandage.  Thumb spica splint was placed and wrapped with Kerlix and Ace bandage.  The tourniquet was deflated at 42 minutes.  Fingertips were pink with brisk capillary refill after deflation of tourniquet.  The operative  drapes were broken down.  The patient was awoken from anesthesia safely.  She was transferred back to the stretcher and taken to PACU in stable condition.  I will see her back in the office in 1 week for postoperative followup.  I will give her a prescription for Percocet 5/325 1 tab PO q6 hours prn pain, dispense #20.   Patricia Mackie, MD Electronically signed, 10/14/24

## 2024-10-14 NOTE — Discharge Instructions (Addendum)
 [facility] Instructions Hand Surgery  Wound Care: Keep your hand elevated above the level of your heart.  Do not allow it to dangle by your side.  Keep the dressing dry and do not remove it unless your doctor advises you to do so.  He will usually change it at the time of your post-op visit.  Moving your fingers is advised to stimulate circulation but will depend on the site of your surgery.  If you have a splint applied, your doctor will advise you regarding movement.  Activity: Do not drive or operate machinery today.  Rest today and then you may return to your normal activity and work as indicated by your physician.  Diet:  Drink liquids today or eat a light diet.  You may resume a regular diet tomorrow.    General expectations: Pain for two to three days. Fingers may become slightly swollen.  Call your doctor if any of the following occur: Severe pain not relieved by pain medication. Elevated temperature. Dressing soaked with blood. Inability to move fingers. White or bluish color to fingers     . Regional Anesthesia Blocks  1. You may not be able to move or feel the blocked extremity after a regional anesthetic block. This may last may last from 3-48 hours after placement, but it will go away. The length of time depends on the medication injected and your individual response to the medication. As the nerves start to wake up, you may experience tingling as the movement and feeling returns to your extremity. If the numbness and inability to move your extremity has not gone away after 48 hours, please call your surgeon.   2. The extremity that is blocked will need to be protected until the numbness is gone and the strength has returned. Because you cannot feel it, you will need to take extra care to avoid injury. Because it may be weak, you may have difficulty moving it or using it. You may not know what position it is in without looking at it while the block is in effect.  3.  For blocks in the legs and feet, returning to weight bearing and walking needs to be done carefully. You will need to wait until the numbness is entirely gone and the strength has returned. You should be able to move your leg and foot normally before you try and bear weight or walk. You will need someone to be with you when you first try to ensure you do not fall and possibly risk injury.  4. Bruising and tenderness at the needle site are common side effects and will resolve in a few days.  5. Persistent numbness or new problems with movement should be communicated to the surgeon or the [facility] [phone] [facility] [phone]).    Post Anesthesia Home Care Instructions  Activity: Get plenty of rest for the remainder of the day. A responsible individual must stay with you for 24 hours following the procedure.  For the next 24 hours, DO NOT: -Drive a car -Advertising copywriter -Drink alcoholic beverages -Take any medication unless instructed by your physician -Make any legal decisions or sign important papers.  Meals: Start with liquid foods such as gelatin or soup. Progress to regular foods as tolerated. Avoid greasy, spicy, heavy foods. If nausea and/or vomiting occur, drink only clear liquids until the nausea and/or vomiting subsides. Call your physician if vomiting continues.  Special Instructions/Symptoms: Your throat may feel dry or sore from  the anesthesia or the breathing tube placed in your throat during surgery. If this causes discomfort, gargle with warm salt water. The discomfort should disappear within 24 hours.  If you had a scopolamine patch placed behind your ear for the management of post- operative nausea and/or vomiting:  1. The medication in the patch is effective for 72 hours, after which it should be removed.  Wrap patch in a tissue and discard in the trash. Wash hands thoroughly with soap and water. 2. You may remove the patch  earlier than 72 hours if you experience unpleasant side effects which may include dry mouth, dizziness or visual disturbances. 3. Avoid touching the patch. Wash your hands with soap and water after contact with the patch.     Next dose of tylenol  may be taken at 4p

## 2024-10-14 NOTE — Anesthesia Postprocedure Evaluation (Signed)
 Anesthesia Post Note  Patient: [name]  Procedure(s) Performed: RIGHT THUMB TRAPEZIECTOMY AND SUSPENSIONPLASTY (Right: Thumb)     Patient location during evaluation: PACU Anesthesia Type: Regional and MAC Level of consciousness: awake and alert Pain management: pain level controlled Vital Signs Assessment: post-procedure vital signs reviewed and stable Respiratory status: spontaneous breathing, nonlabored ventilation and respiratory function stable Cardiovascular status: blood pressure returned to baseline Postop Assessment: no apparent nausea or vomiting Anesthetic complications: no   No notable events documented.  Last Vitals:  Vitals:   10/14/24 1219 10/14/24 1230  BP:  (!) 104/58  Pulse: 64 74  Resp: 15 14  Temp:  (!) 36.3 C  SpO2: 94% 93%    Last Pain:  Vitals:   10/14/24 1230  TempSrc: Temporal  PainSc: 0-No pain                 Vertell Row

## 2024-10-14 NOTE — Transfer of Care (Signed)
 Immediate Anesthesia Transfer of Care Note  Patient: [name]  Procedure(s) Performed: RIGHT THUMB TRAPEZIECTOMY AND SUSPENSIONPLASTY (Right: Thumb)  Patient Location: PACU  Anesthesia Type:MAC, General, and Regional  Level of Consciousness: awake  Airway & Oxygen Therapy: Patient connected to face mask oxygen  Post-op Assessment: Report given to RN and Post -op Vital signs reviewed and stable  Post vital signs: Reviewed and stable  Last Vitals:  Vitals Value Taken Time  BP 98/62 10/14/24 11:48  Temp    Pulse 64 10/14/24 11:50  Resp 13 10/14/24 11:50  SpO2 100 % 10/14/24 11:50  Vitals shown include unfiled device data.  Last Pain:  Vitals:   10/14/24 0948  TempSrc: Tympanic  PainSc: 8       Patients Stated Pain Goal: 8 (10/14/24 0948)  Complications: No notable events documented.

## 2024-10-14 NOTE — Anesthesia Preprocedure Evaluation (Addendum)
 Anesthesia Evaluation  Patient identified by MRN, date of birth, ID band Patient awake    Reviewed: Allergy & Precautions, NPO status , Patient's Chart, lab work & pertinent test results  History of Anesthesia Complications Negative for: history of anesthetic complications  Airway Mallampati: II  TM Distance: >3 FB Neck ROM: Full    Dental no notable dental hx.    Pulmonary neg pulmonary ROS   Pulmonary exam normal        Cardiovascular Normal cardiovascular exam  LBBB   Neuro/Psych  Headaches    GI/Hepatic negative GI ROS, Neg liver ROS,,,  Endo/Other  negative endocrine ROS    Renal/GU negative Renal ROS     Musculoskeletal RIGHT THUMB CARPOMETACARPAL ARTHRITIS   Abdominal   Peds  Hematology negative hematology ROS (+)   Anesthesia Other Findings   Reproductive/Obstetrics                              Anesthesia Physical Anesthesia Plan  ASA: 2  Anesthesia Plan: MAC and Regional   Post-op Pain Management: Tylenol  PO (pre-op)*   Induction:   PONV Risk Score and Plan: 2 and Treatment may vary due to age or medical condition, Ondansetron , Midazolam  and Propofol  infusion  Airway Management Planned: Natural Airway and Simple Face Mask  Additional Equipment: None  Intra-op Plan:   Post-operative Plan:   Informed Consent: I have reviewed the patients History and Physical, chart, labs and discussed the procedure including the risks, benefits and alternatives for the proposed anesthesia with the patient or authorized representative who has indicated his/her understanding and acceptance.       Plan Discussed with: CRNA  Anesthesia Plan Comments:          Anesthesia Quick Evaluation

## 2024-10-14 NOTE — Anesthesia Procedure Notes (Signed)
 Anesthesia Regional Block: Supraclavicular block   Pre-Anesthetic Checklist: , timeout performed,  Correct Patient, Correct Site, Correct Laterality,  Correct Procedure, Correct Position, site marked,  Risks and benefits discussed,  Pre-op evaluation,  At surgeon's request and post-op pain management  Laterality: Right  Prep: Maximum Sterile Barrier Precautions used, chloraprep       Needles:  Injection technique: Single-shot  Needle Type: Echogenic Stimulator Needle     Needle Length: 9cm  Needle Gauge: 22     Additional Needles:   Procedures:,,,, ultrasound used (permanent image in chart),,    Narrative:  Start time: 10/14/2024 10:13 AM End time: 10/14/2024 10:16 AM Injection made incrementally with aspirations every 5 mL.  Performed by: Personally  Anesthesiologist: Paul Lamarr BRAVO, MD  Additional Notes: Risks, benefits, and alternative discussed. Patient gave consent for procedure. Patient prepped and draped in sterile fashion. Sedation administered, patient remains easily responsive to voice. Relevant anatomy identified with ultrasound guidance. Local anesthetic given in 5cc increments with no signs or symptoms of intravascular injection. No pain or paraesthesias with injection. Patient monitored throughout procedure with no signs of LAST or immediate complications. Tolerated well. Ultrasound image placed in chart.  LANEY Paul, MD

## 2024-10-14 NOTE — Op Note (Signed)
 I assisted Surgeons and Role:    * Murrell Drivers, MD - Primary    DEWAINE Murrell Kuba, MD - Assisting on the Procedure(s): RIGHT THUMB TRAPEZIECTOMY AND SUSPENSIONPLASTY on 10/14/2024.  I provided assistance on this case as follows: set up, approach, identification and protection of the radial nerve and radial artery, isolation and removal of the trapezium, harvesting of the flexor carpi radialis tendon, placement of the micro link internal brace, transfer of the flexor carpi radialis tendon, closure of the wound and application of the dressing and splint.  Electronically signed by: Kuba Murrell, MD Date: 10/14/2024 Time: 11:43 AM

## 2024-10-14 NOTE — H&P (Signed)
 [name] is an 63 y.o. female.   Chief Complaint: cmc arthritis HPI: 63 yo female with right thumb cmc arthritis.  She has tried non operative measures without lasting relief.  She wishes to proceed with right thumb trapeziectomy and suspensionplasty.  Allergies:  Allergies  Allergen Reactions   Codeine Nausea Only   Demerol  [Meperidine Hcl] Nausea And Vomiting   Erythromycin Base Other (See Comments)    Turns patient jaundice    Augmentin [Amoxicillin-Pot Clavulanate] Nausea And Vomiting   Latex Rash   Tape Rash    Includes bandaids    Past Medical History:  Diagnosis Date   Breast mass, right    Chronic back pain    Dysrhythmia    LBBB   Headache    History of kidney stones    1990 & 2020   Hyperlipidemia    Peripheral edema     Past Surgical History:  Procedure Laterality Date   ABDOMINAL HYSTERECTOMY     BREAST BIOPSY Right 11/18/2005   BREAST EXCISIONAL BIOPSY     BREAST LUMPECTOMY WITH RADIOACTIVE SEED LOCALIZATION Right 09/09/2022   Procedure: RIGHT BREAST LUMPECTOMY WITH RADIOACTIVE SEED LOCALIZATION;  Surgeon: Curvin Deward MOULD, MD;  Location: [facility];  Service: General;  Laterality: Right;   CERVICAL FUSION     CHOLECYSTECTOMY     DECOMPRESSIVE LUMBAR LAMINECTOMY LEVEL 1 Left 12/15/2023   Procedure: Sublaminar decompression - Lumbar Three-Lumbar Four - left;  Surgeon: Colon Shove, MD;  Location: [facility] OR;  Service: Neurosurgery;  Laterality: Left;  C3   LUMBAR FUSION     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 She has never used smokeless tobacco. She reports that she does not currently use alcohol. She reports that she does not use drugs.  Medications: Medications Prior to Admission  Medication Sig Dispense Refill    cyclobenzaprine  (FLEXERIL ) 10 MG tablet Take 1 tablet (10 mg total) by mouth 3 (three) times daily as needed for muscle spasms. 30 tablet 3   furosemide  (LASIX ) 40 MG tablet Take 40 mg by mouth in the morning. May take an additional dose if needed for edema     Phenylephrine -APAP-guaiFENesin (TYLENOL  SINUS SEVERE) 5-325-200 MG TABS Take 1 tablet by mouth daily as needed (sinus pressure).     rizatriptan (MAXALT) 10 MG tablet Take 10 mg by mouth every 2 (two) hours as needed for migraine.     rosuvastatin  (CRESTOR ) 20 MG tablet Take 1 tablet (20 mg total) by mouth daily. 90 tablet 3   zolpidem  (AMBIEN ) 5 MG tablet Take 2.5-5 mg by mouth at bedtime as needed for sleep.     dexamethasone  (DECADRON ) 1 MG tablet 2 tablets twice daily for 2 days, one tablet twice daily for 2 days, one tablet daily for 2 days. 15 tablet 0   HYDROcodone -acetaminophen  (NORCO/VICODIN) 5-325 MG tablet Take 1-2 tablets by mouth every 4 (four) hours as needed for moderate pain (pain score 4-6). 40 tablet 0   traMADol  (ULTRAM ) 50 MG tablet Take 50 mg by mouth every 6 (six) hours as needed (pain.).      No results found for this or any previous visit (from  the past 48 hours).  No results found.    Blood pressure 136/74, pulse 73, temperature 98.5 F (36.9 C), temperature source Tympanic, resp. rate 11, height 5' 4 (1.626 m), weight 76 kg, SpO2 98%.  General appearance: alert, cooperative, and appears stated age Head: Normocephalic, without obvious abnormality, atraumatic Neck: supple, symmetrical, trachea midline Extremities: Intact sensation and capillary refill all digits.  +epl/fpl/io.  No wounds.  Skin: Skin color, texture, turgor normal. No rashes or lesions Neurologic: Grossly normal Incision/Wound: none  Assessment/Plan Right thumb cmc arthritis.  Non operative and operative treatment options have been discussed with the patient and patient wishes to proceed with operative treatment. Risks, benefits, and  alternatives of surgery have been discussed and the patient agrees with the plan of care.   [name] 10/14/2024, 10:10 AM

## 2024-10-14 NOTE — Progress Notes (Signed)
Assisted Dr. Howze with right, supraclavicular, ultrasound guided block. Side rails up, monitors on throughout procedure. See vital signs in flow sheet. Tolerated Procedure well. 

## 2024-10-15 ENCOUNTER — Encounter (HOSPITAL_BASED_OUTPATIENT_CLINIC_OR_DEPARTMENT_OTHER): Payer: Self-pay | Admitting: Orthopedic Surgery

## 2024-10-22 DIAGNOSIS — M1811 Unilateral primary osteoarthritis of first carpometacarpal joint, right hand: Secondary | ICD-10-CM | POA: Diagnosis not present

## 2024-10-28 DIAGNOSIS — M1811 Unilateral primary osteoarthritis of first carpometacarpal joint, right hand: Secondary | ICD-10-CM | POA: Diagnosis not present

## 2024-11-09 DIAGNOSIS — R29898 Other symptoms and signs involving the musculoskeletal system: Secondary | ICD-10-CM | POA: Diagnosis not present

## 2024-11-09 DIAGNOSIS — M79641 Pain in right hand: Secondary | ICD-10-CM | POA: Diagnosis not present

## 2024-11-09 DIAGNOSIS — M1811 Unilateral primary osteoarthritis of first carpometacarpal joint, right hand: Secondary | ICD-10-CM | POA: Diagnosis not present
